# Patient Record
Sex: Female | Born: 1940 | Race: White | Hispanic: No | Smoking: Never smoker
Health system: Southern US, Community
[De-identification: ages and names within clinical notes are randomized; demographics above are authoritative.]

## PROBLEM LIST (undated history)

## (undated) DIAGNOSIS — R6251 Failure to thrive (child): Secondary | ICD-10-CM

## (undated) DIAGNOSIS — I739 Peripheral vascular disease, unspecified: Secondary | ICD-10-CM

## (undated) DIAGNOSIS — K219 Gastro-esophageal reflux disease without esophagitis: Secondary | ICD-10-CM

## (undated) DIAGNOSIS — F0390 Unspecified dementia without behavioral disturbance: Secondary | ICD-10-CM

## (undated) DIAGNOSIS — F419 Anxiety disorder, unspecified: Secondary | ICD-10-CM

## (undated) DIAGNOSIS — F259 Schizoaffective disorder, unspecified: Secondary | ICD-10-CM

## (undated) HISTORY — PX: OTHER SURGICAL HISTORY: SHX169

## (undated) HISTORY — DX: Schizoaffective disorder, unspecified: F25.9

## (undated) HISTORY — PX: DILATION AND CURETTAGE, DIAGNOSTIC / THERAPEUTIC: SUR384

## (undated) HISTORY — PX: APPENDECTOMY: SHX54

## (undated) HISTORY — DX: Failure to thrive (child): R62.51

## (undated) HISTORY — DX: Gastro-esophageal reflux disease without esophagitis: K21.9

## (undated) HISTORY — DX: Unspecified dementia, unspecified severity, without behavioral disturbance, psychotic disturbance, mood disturbance, and anxiety: F03.90

## (undated) HISTORY — PX: CHOLECYSTECTOMY: SHX55

## (undated) HISTORY — DX: Anxiety disorder, unspecified: F41.9

## (undated) HISTORY — DX: Peripheral vascular disease, unspecified: I73.9

## (undated) HISTORY — PX: PEG PLACEMENT: SHX5437

---

## 2004-08-12 ENCOUNTER — Ambulatory Visit: Payer: Self-pay | Admitting: Internal Medicine

## 2004-08-19 ENCOUNTER — Ambulatory Visit: Payer: Self-pay | Admitting: Cardiology

## 2004-12-19 ENCOUNTER — Encounter: Admission: RE | Admit: 2004-12-19 | Discharge: 2004-12-19 | Payer: Self-pay | Admitting: General Surgery

## 2006-01-04 ENCOUNTER — Encounter: Admission: RE | Admit: 2006-01-04 | Discharge: 2006-01-04 | Payer: Self-pay | Admitting: General Surgery

## 2015-02-08 DIAGNOSIS — F79 Unspecified intellectual disabilities: Secondary | ICD-10-CM | POA: Diagnosis not present

## 2015-02-08 DIAGNOSIS — Z87891 Personal history of nicotine dependence: Secondary | ICD-10-CM | POA: Diagnosis not present

## 2015-02-08 DIAGNOSIS — I739 Peripheral vascular disease, unspecified: Secondary | ICD-10-CM | POA: Diagnosis not present

## 2015-02-08 DIAGNOSIS — K219 Gastro-esophageal reflux disease without esophagitis: Secondary | ICD-10-CM | POA: Diagnosis not present

## 2015-02-08 DIAGNOSIS — Z418 Encounter for other procedures for purposes other than remedying health state: Secondary | ICD-10-CM | POA: Diagnosis not present

## 2015-02-18 DIAGNOSIS — Z1231 Encounter for screening mammogram for malignant neoplasm of breast: Secondary | ICD-10-CM | POA: Diagnosis not present

## 2015-04-18 DIAGNOSIS — H2513 Age-related nuclear cataract, bilateral: Secondary | ICD-10-CM | POA: Diagnosis not present

## 2015-05-14 DIAGNOSIS — Z6833 Body mass index (BMI) 33.0-33.9, adult: Secondary | ICD-10-CM | POA: Diagnosis not present

## 2015-05-14 DIAGNOSIS — E7800 Pure hypercholesterolemia, unspecified: Secondary | ICD-10-CM | POA: Diagnosis not present

## 2015-05-14 DIAGNOSIS — Z713 Dietary counseling and surveillance: Secondary | ICD-10-CM | POA: Diagnosis not present

## 2015-05-14 DIAGNOSIS — F79 Unspecified intellectual disabilities: Secondary | ICD-10-CM | POA: Diagnosis not present

## 2015-05-14 DIAGNOSIS — Z299 Encounter for prophylactic measures, unspecified: Secondary | ICD-10-CM | POA: Diagnosis not present

## 2015-06-20 DIAGNOSIS — F0390 Unspecified dementia without behavioral disturbance: Secondary | ICD-10-CM | POA: Diagnosis not present

## 2015-06-20 DIAGNOSIS — E7800 Pure hypercholesterolemia, unspecified: Secondary | ICD-10-CM | POA: Diagnosis not present

## 2015-08-28 DIAGNOSIS — E7800 Pure hypercholesterolemia, unspecified: Secondary | ICD-10-CM | POA: Diagnosis not present

## 2015-08-28 DIAGNOSIS — F0390 Unspecified dementia without behavioral disturbance: Secondary | ICD-10-CM | POA: Diagnosis not present

## 2015-09-24 DIAGNOSIS — Z1389 Encounter for screening for other disorder: Secondary | ICD-10-CM | POA: Diagnosis not present

## 2015-09-24 DIAGNOSIS — Z1211 Encounter for screening for malignant neoplasm of colon: Secondary | ICD-10-CM | POA: Diagnosis not present

## 2015-09-24 DIAGNOSIS — Z79899 Other long term (current) drug therapy: Secondary | ICD-10-CM | POA: Diagnosis not present

## 2015-09-24 DIAGNOSIS — Z6832 Body mass index (BMI) 32.0-32.9, adult: Secondary | ICD-10-CM | POA: Diagnosis not present

## 2015-09-24 DIAGNOSIS — E7800 Pure hypercholesterolemia, unspecified: Secondary | ICD-10-CM | POA: Diagnosis not present

## 2015-09-24 DIAGNOSIS — Z7189 Other specified counseling: Secondary | ICD-10-CM | POA: Diagnosis not present

## 2015-09-24 DIAGNOSIS — Z0000 Encounter for general adult medical examination without abnormal findings: Secondary | ICD-10-CM | POA: Diagnosis not present

## 2015-09-24 DIAGNOSIS — R5383 Other fatigue: Secondary | ICD-10-CM | POA: Diagnosis not present

## 2015-09-24 DIAGNOSIS — Z299 Encounter for prophylactic measures, unspecified: Secondary | ICD-10-CM | POA: Diagnosis not present

## 2015-10-18 DIAGNOSIS — Z6833 Body mass index (BMI) 33.0-33.9, adult: Secondary | ICD-10-CM | POA: Diagnosis not present

## 2015-10-18 DIAGNOSIS — I739 Peripheral vascular disease, unspecified: Secondary | ICD-10-CM | POA: Diagnosis not present

## 2015-10-18 DIAGNOSIS — E7800 Pure hypercholesterolemia, unspecified: Secondary | ICD-10-CM | POA: Diagnosis not present

## 2015-10-18 DIAGNOSIS — F79 Unspecified intellectual disabilities: Secondary | ICD-10-CM | POA: Diagnosis not present

## 2015-10-18 DIAGNOSIS — Z299 Encounter for prophylactic measures, unspecified: Secondary | ICD-10-CM | POA: Diagnosis not present

## 2015-10-31 DIAGNOSIS — F0390 Unspecified dementia without behavioral disturbance: Secondary | ICD-10-CM | POA: Diagnosis not present

## 2015-10-31 DIAGNOSIS — E7800 Pure hypercholesterolemia, unspecified: Secondary | ICD-10-CM | POA: Diagnosis not present

## 2015-11-19 DIAGNOSIS — M810 Age-related osteoporosis without current pathological fracture: Secondary | ICD-10-CM | POA: Diagnosis not present

## 2015-11-19 DIAGNOSIS — M8580 Other specified disorders of bone density and structure, unspecified site: Secondary | ICD-10-CM | POA: Diagnosis not present

## 2015-12-18 DIAGNOSIS — F0390 Unspecified dementia without behavioral disturbance: Secondary | ICD-10-CM | POA: Diagnosis not present

## 2015-12-18 DIAGNOSIS — E7800 Pure hypercholesterolemia, unspecified: Secondary | ICD-10-CM | POA: Diagnosis not present

## 2016-01-24 DIAGNOSIS — F0390 Unspecified dementia without behavioral disturbance: Secondary | ICD-10-CM | POA: Diagnosis not present

## 2016-01-24 DIAGNOSIS — E7800 Pure hypercholesterolemia, unspecified: Secondary | ICD-10-CM | POA: Diagnosis not present

## 2016-02-07 DIAGNOSIS — Z299 Encounter for prophylactic measures, unspecified: Secondary | ICD-10-CM | POA: Diagnosis not present

## 2016-02-07 DIAGNOSIS — J069 Acute upper respiratory infection, unspecified: Secondary | ICD-10-CM | POA: Diagnosis not present

## 2016-02-07 DIAGNOSIS — E7800 Pure hypercholesterolemia, unspecified: Secondary | ICD-10-CM | POA: Diagnosis not present

## 2016-02-07 DIAGNOSIS — I739 Peripheral vascular disease, unspecified: Secondary | ICD-10-CM | POA: Diagnosis not present

## 2016-02-07 DIAGNOSIS — F79 Unspecified intellectual disabilities: Secondary | ICD-10-CM | POA: Diagnosis not present

## 2016-02-07 DIAGNOSIS — Z789 Other specified health status: Secondary | ICD-10-CM | POA: Diagnosis not present

## 2016-02-07 DIAGNOSIS — Z6833 Body mass index (BMI) 33.0-33.9, adult: Secondary | ICD-10-CM | POA: Diagnosis not present

## 2016-02-19 DIAGNOSIS — F0390 Unspecified dementia without behavioral disturbance: Secondary | ICD-10-CM | POA: Diagnosis not present

## 2016-02-19 DIAGNOSIS — E7800 Pure hypercholesterolemia, unspecified: Secondary | ICD-10-CM | POA: Diagnosis not present

## 2016-02-27 DIAGNOSIS — Z1231 Encounter for screening mammogram for malignant neoplasm of breast: Secondary | ICD-10-CM | POA: Diagnosis not present

## 2016-03-27 DIAGNOSIS — E7800 Pure hypercholesterolemia, unspecified: Secondary | ICD-10-CM | POA: Diagnosis not present

## 2016-03-27 DIAGNOSIS — F0390 Unspecified dementia without behavioral disturbance: Secondary | ICD-10-CM | POA: Diagnosis not present

## 2016-04-27 DIAGNOSIS — F0390 Unspecified dementia without behavioral disturbance: Secondary | ICD-10-CM | POA: Diagnosis not present

## 2016-04-27 DIAGNOSIS — E7800 Pure hypercholesterolemia, unspecified: Secondary | ICD-10-CM | POA: Diagnosis not present

## 2016-06-09 DIAGNOSIS — H2513 Age-related nuclear cataract, bilateral: Secondary | ICD-10-CM | POA: Diagnosis not present

## 2016-07-09 DIAGNOSIS — H40013 Open angle with borderline findings, low risk, bilateral: Secondary | ICD-10-CM | POA: Diagnosis not present

## 2016-07-09 DIAGNOSIS — H35033 Hypertensive retinopathy, bilateral: Secondary | ICD-10-CM | POA: Diagnosis not present

## 2016-07-09 DIAGNOSIS — H2513 Age-related nuclear cataract, bilateral: Secondary | ICD-10-CM | POA: Diagnosis not present

## 2016-07-09 DIAGNOSIS — H25013 Cortical age-related cataract, bilateral: Secondary | ICD-10-CM | POA: Diagnosis not present

## 2016-08-06 DIAGNOSIS — Z6832 Body mass index (BMI) 32.0-32.9, adult: Secondary | ICD-10-CM | POA: Diagnosis not present

## 2016-08-06 DIAGNOSIS — Z79899 Other long term (current) drug therapy: Secondary | ICD-10-CM | POA: Diagnosis not present

## 2016-08-06 DIAGNOSIS — Z299 Encounter for prophylactic measures, unspecified: Secondary | ICD-10-CM | POA: Diagnosis not present

## 2016-08-06 DIAGNOSIS — F79 Unspecified intellectual disabilities: Secondary | ICD-10-CM | POA: Diagnosis not present

## 2016-08-06 DIAGNOSIS — I739 Peripheral vascular disease, unspecified: Secondary | ICD-10-CM | POA: Diagnosis not present

## 2016-08-06 DIAGNOSIS — E7800 Pure hypercholesterolemia, unspecified: Secondary | ICD-10-CM | POA: Diagnosis not present

## 2016-08-10 DIAGNOSIS — Z79899 Other long term (current) drug therapy: Secondary | ICD-10-CM | POA: Diagnosis not present

## 2016-08-10 DIAGNOSIS — E7800 Pure hypercholesterolemia, unspecified: Secondary | ICD-10-CM | POA: Diagnosis not present

## 2016-09-16 DIAGNOSIS — F0390 Unspecified dementia without behavioral disturbance: Secondary | ICD-10-CM | POA: Diagnosis not present

## 2016-09-16 DIAGNOSIS — E7800 Pure hypercholesterolemia, unspecified: Secondary | ICD-10-CM | POA: Diagnosis not present

## 2016-09-28 DIAGNOSIS — Z1211 Encounter for screening for malignant neoplasm of colon: Secondary | ICD-10-CM | POA: Diagnosis not present

## 2016-09-28 DIAGNOSIS — Z0000 Encounter for general adult medical examination without abnormal findings: Secondary | ICD-10-CM | POA: Diagnosis not present

## 2016-09-28 DIAGNOSIS — Z1389 Encounter for screening for other disorder: Secondary | ICD-10-CM | POA: Diagnosis not present

## 2016-09-28 DIAGNOSIS — R5383 Other fatigue: Secondary | ICD-10-CM | POA: Diagnosis not present

## 2016-09-28 DIAGNOSIS — Z6832 Body mass index (BMI) 32.0-32.9, adult: Secondary | ICD-10-CM | POA: Diagnosis not present

## 2016-09-28 DIAGNOSIS — F79 Unspecified intellectual disabilities: Secondary | ICD-10-CM | POA: Diagnosis not present

## 2016-09-28 DIAGNOSIS — Z7189 Other specified counseling: Secondary | ICD-10-CM | POA: Diagnosis not present

## 2016-09-28 DIAGNOSIS — E7800 Pure hypercholesterolemia, unspecified: Secondary | ICD-10-CM | POA: Diagnosis not present

## 2016-09-28 DIAGNOSIS — Z299 Encounter for prophylactic measures, unspecified: Secondary | ICD-10-CM | POA: Diagnosis not present

## 2016-09-28 DIAGNOSIS — E559 Vitamin D deficiency, unspecified: Secondary | ICD-10-CM | POA: Diagnosis not present

## 2016-09-28 DIAGNOSIS — Z79899 Other long term (current) drug therapy: Secondary | ICD-10-CM | POA: Diagnosis not present

## 2016-09-28 DIAGNOSIS — I739 Peripheral vascular disease, unspecified: Secondary | ICD-10-CM | POA: Diagnosis not present

## 2016-09-30 DIAGNOSIS — H2513 Age-related nuclear cataract, bilateral: Secondary | ICD-10-CM | POA: Diagnosis not present

## 2016-09-30 DIAGNOSIS — H1859 Other hereditary corneal dystrophies: Secondary | ICD-10-CM | POA: Diagnosis not present

## 2016-11-18 DIAGNOSIS — E7800 Pure hypercholesterolemia, unspecified: Secondary | ICD-10-CM | POA: Diagnosis not present

## 2016-11-18 DIAGNOSIS — F0390 Unspecified dementia without behavioral disturbance: Secondary | ICD-10-CM | POA: Diagnosis not present

## 2016-12-03 DIAGNOSIS — F0390 Unspecified dementia without behavioral disturbance: Secondary | ICD-10-CM | POA: Diagnosis not present

## 2016-12-03 DIAGNOSIS — E7800 Pure hypercholesterolemia, unspecified: Secondary | ICD-10-CM | POA: Diagnosis not present

## 2016-12-07 DIAGNOSIS — H2513 Age-related nuclear cataract, bilateral: Secondary | ICD-10-CM | POA: Diagnosis not present

## 2016-12-07 DIAGNOSIS — H1859 Other hereditary corneal dystrophies: Secondary | ICD-10-CM | POA: Diagnosis not present

## 2016-12-28 DIAGNOSIS — Z6832 Body mass index (BMI) 32.0-32.9, adult: Secondary | ICD-10-CM | POA: Diagnosis not present

## 2016-12-28 DIAGNOSIS — Z2821 Immunization not carried out because of patient refusal: Secondary | ICD-10-CM | POA: Diagnosis not present

## 2016-12-28 DIAGNOSIS — Z299 Encounter for prophylactic measures, unspecified: Secondary | ICD-10-CM | POA: Diagnosis not present

## 2016-12-28 DIAGNOSIS — E7800 Pure hypercholesterolemia, unspecified: Secondary | ICD-10-CM | POA: Diagnosis not present

## 2016-12-28 DIAGNOSIS — I739 Peripheral vascular disease, unspecified: Secondary | ICD-10-CM | POA: Diagnosis not present

## 2017-01-11 DIAGNOSIS — E7800 Pure hypercholesterolemia, unspecified: Secondary | ICD-10-CM | POA: Diagnosis not present

## 2017-01-11 DIAGNOSIS — F0390 Unspecified dementia without behavioral disturbance: Secondary | ICD-10-CM | POA: Diagnosis not present

## 2017-02-05 DIAGNOSIS — F79 Unspecified intellectual disabilities: Secondary | ICD-10-CM | POA: Diagnosis not present

## 2017-02-05 DIAGNOSIS — Z299 Encounter for prophylactic measures, unspecified: Secondary | ICD-10-CM | POA: Diagnosis not present

## 2017-02-05 DIAGNOSIS — I739 Peripheral vascular disease, unspecified: Secondary | ICD-10-CM | POA: Diagnosis not present

## 2017-02-05 DIAGNOSIS — E7800 Pure hypercholesterolemia, unspecified: Secondary | ICD-10-CM | POA: Diagnosis not present

## 2017-02-05 DIAGNOSIS — Z6832 Body mass index (BMI) 32.0-32.9, adult: Secondary | ICD-10-CM | POA: Diagnosis not present

## 2017-02-05 DIAGNOSIS — Z789 Other specified health status: Secondary | ICD-10-CM | POA: Diagnosis not present

## 2017-03-01 DIAGNOSIS — Z1231 Encounter for screening mammogram for malignant neoplasm of breast: Secondary | ICD-10-CM | POA: Diagnosis not present

## 2017-03-02 DIAGNOSIS — F0390 Unspecified dementia without behavioral disturbance: Secondary | ICD-10-CM | POA: Diagnosis not present

## 2017-03-02 DIAGNOSIS — E7800 Pure hypercholesterolemia, unspecified: Secondary | ICD-10-CM | POA: Diagnosis not present

## 2017-03-15 DIAGNOSIS — H2513 Age-related nuclear cataract, bilateral: Secondary | ICD-10-CM | POA: Diagnosis not present

## 2017-03-15 DIAGNOSIS — H1859 Other hereditary corneal dystrophies: Secondary | ICD-10-CM | POA: Diagnosis not present

## 2017-03-30 DIAGNOSIS — F0390 Unspecified dementia without behavioral disturbance: Secondary | ICD-10-CM | POA: Diagnosis not present

## 2017-03-30 DIAGNOSIS — E7800 Pure hypercholesterolemia, unspecified: Secondary | ICD-10-CM | POA: Diagnosis not present

## 2017-04-26 DIAGNOSIS — F0390 Unspecified dementia without behavioral disturbance: Secondary | ICD-10-CM | POA: Diagnosis not present

## 2017-04-26 DIAGNOSIS — E7800 Pure hypercholesterolemia, unspecified: Secondary | ICD-10-CM | POA: Diagnosis not present

## 2017-05-19 DIAGNOSIS — H2513 Age-related nuclear cataract, bilateral: Secondary | ICD-10-CM | POA: Diagnosis not present

## 2017-05-19 DIAGNOSIS — H1859 Other hereditary corneal dystrophies: Secondary | ICD-10-CM | POA: Diagnosis not present

## 2017-06-21 DIAGNOSIS — Z6832 Body mass index (BMI) 32.0-32.9, adult: Secondary | ICD-10-CM | POA: Diagnosis not present

## 2017-06-21 DIAGNOSIS — E7800 Pure hypercholesterolemia, unspecified: Secondary | ICD-10-CM | POA: Diagnosis not present

## 2017-06-21 DIAGNOSIS — F79 Unspecified intellectual disabilities: Secondary | ICD-10-CM | POA: Diagnosis not present

## 2017-06-21 DIAGNOSIS — M1990 Unspecified osteoarthritis, unspecified site: Secondary | ICD-10-CM | POA: Diagnosis not present

## 2017-06-21 DIAGNOSIS — Z299 Encounter for prophylactic measures, unspecified: Secondary | ICD-10-CM | POA: Diagnosis not present

## 2017-06-21 DIAGNOSIS — M1710 Unilateral primary osteoarthritis, unspecified knee: Secondary | ICD-10-CM | POA: Diagnosis not present

## 2017-07-01 DIAGNOSIS — F79 Unspecified intellectual disabilities: Secondary | ICD-10-CM | POA: Diagnosis not present

## 2017-07-01 DIAGNOSIS — I739 Peripheral vascular disease, unspecified: Secondary | ICD-10-CM | POA: Diagnosis not present

## 2017-07-01 DIAGNOSIS — K219 Gastro-esophageal reflux disease without esophagitis: Secondary | ICD-10-CM | POA: Diagnosis not present

## 2017-07-01 DIAGNOSIS — E7800 Pure hypercholesterolemia, unspecified: Secondary | ICD-10-CM | POA: Diagnosis not present

## 2017-07-01 DIAGNOSIS — Z87891 Personal history of nicotine dependence: Secondary | ICD-10-CM | POA: Diagnosis not present

## 2017-07-01 DIAGNOSIS — M1712 Unilateral primary osteoarthritis, left knee: Secondary | ICD-10-CM | POA: Diagnosis not present

## 2017-07-01 DIAGNOSIS — M810 Age-related osteoporosis without current pathological fracture: Secondary | ICD-10-CM | POA: Diagnosis not present

## 2017-07-05 DIAGNOSIS — K219 Gastro-esophageal reflux disease without esophagitis: Secondary | ICD-10-CM | POA: Diagnosis not present

## 2017-07-05 DIAGNOSIS — M810 Age-related osteoporosis without current pathological fracture: Secondary | ICD-10-CM | POA: Diagnosis not present

## 2017-07-05 DIAGNOSIS — M1712 Unilateral primary osteoarthritis, left knee: Secondary | ICD-10-CM | POA: Diagnosis not present

## 2017-07-05 DIAGNOSIS — I739 Peripheral vascular disease, unspecified: Secondary | ICD-10-CM | POA: Diagnosis not present

## 2017-07-05 DIAGNOSIS — E7800 Pure hypercholesterolemia, unspecified: Secondary | ICD-10-CM | POA: Diagnosis not present

## 2017-07-05 DIAGNOSIS — F79 Unspecified intellectual disabilities: Secondary | ICD-10-CM | POA: Diagnosis not present

## 2017-07-09 DIAGNOSIS — M1712 Unilateral primary osteoarthritis, left knee: Secondary | ICD-10-CM | POA: Diagnosis not present

## 2017-07-09 DIAGNOSIS — M810 Age-related osteoporosis without current pathological fracture: Secondary | ICD-10-CM | POA: Diagnosis not present

## 2017-07-09 DIAGNOSIS — K219 Gastro-esophageal reflux disease without esophagitis: Secondary | ICD-10-CM | POA: Diagnosis not present

## 2017-07-09 DIAGNOSIS — F79 Unspecified intellectual disabilities: Secondary | ICD-10-CM | POA: Diagnosis not present

## 2017-07-09 DIAGNOSIS — E7800 Pure hypercholesterolemia, unspecified: Secondary | ICD-10-CM | POA: Diagnosis not present

## 2017-07-09 DIAGNOSIS — I739 Peripheral vascular disease, unspecified: Secondary | ICD-10-CM | POA: Diagnosis not present

## 2017-07-13 DIAGNOSIS — K219 Gastro-esophageal reflux disease without esophagitis: Secondary | ICD-10-CM | POA: Diagnosis not present

## 2017-07-13 DIAGNOSIS — I739 Peripheral vascular disease, unspecified: Secondary | ICD-10-CM | POA: Diagnosis not present

## 2017-07-13 DIAGNOSIS — E7800 Pure hypercholesterolemia, unspecified: Secondary | ICD-10-CM | POA: Diagnosis not present

## 2017-07-13 DIAGNOSIS — F79 Unspecified intellectual disabilities: Secondary | ICD-10-CM | POA: Diagnosis not present

## 2017-07-13 DIAGNOSIS — M810 Age-related osteoporosis without current pathological fracture: Secondary | ICD-10-CM | POA: Diagnosis not present

## 2017-07-13 DIAGNOSIS — M1712 Unilateral primary osteoarthritis, left knee: Secondary | ICD-10-CM | POA: Diagnosis not present

## 2017-07-14 DIAGNOSIS — M810 Age-related osteoporosis without current pathological fracture: Secondary | ICD-10-CM | POA: Diagnosis not present

## 2017-07-14 DIAGNOSIS — K219 Gastro-esophageal reflux disease without esophagitis: Secondary | ICD-10-CM | POA: Diagnosis not present

## 2017-07-14 DIAGNOSIS — M1712 Unilateral primary osteoarthritis, left knee: Secondary | ICD-10-CM | POA: Diagnosis not present

## 2017-07-14 DIAGNOSIS — F79 Unspecified intellectual disabilities: Secondary | ICD-10-CM | POA: Diagnosis not present

## 2017-07-14 DIAGNOSIS — I739 Peripheral vascular disease, unspecified: Secondary | ICD-10-CM | POA: Diagnosis not present

## 2017-07-14 DIAGNOSIS — E7800 Pure hypercholesterolemia, unspecified: Secondary | ICD-10-CM | POA: Diagnosis not present

## 2017-07-19 DIAGNOSIS — F79 Unspecified intellectual disabilities: Secondary | ICD-10-CM | POA: Diagnosis not present

## 2017-07-19 DIAGNOSIS — I739 Peripheral vascular disease, unspecified: Secondary | ICD-10-CM | POA: Diagnosis not present

## 2017-07-19 DIAGNOSIS — M1712 Unilateral primary osteoarthritis, left knee: Secondary | ICD-10-CM | POA: Diagnosis not present

## 2017-07-19 DIAGNOSIS — E7800 Pure hypercholesterolemia, unspecified: Secondary | ICD-10-CM | POA: Diagnosis not present

## 2017-07-19 DIAGNOSIS — K219 Gastro-esophageal reflux disease without esophagitis: Secondary | ICD-10-CM | POA: Diagnosis not present

## 2017-07-19 DIAGNOSIS — M810 Age-related osteoporosis without current pathological fracture: Secondary | ICD-10-CM | POA: Diagnosis not present

## 2017-07-21 DIAGNOSIS — F0390 Unspecified dementia without behavioral disturbance: Secondary | ICD-10-CM | POA: Diagnosis not present

## 2017-07-21 DIAGNOSIS — E7800 Pure hypercholesterolemia, unspecified: Secondary | ICD-10-CM | POA: Diagnosis not present

## 2017-07-21 DIAGNOSIS — M1712 Unilateral primary osteoarthritis, left knee: Secondary | ICD-10-CM | POA: Diagnosis not present

## 2017-07-21 DIAGNOSIS — I739 Peripheral vascular disease, unspecified: Secondary | ICD-10-CM | POA: Diagnosis not present

## 2017-07-21 DIAGNOSIS — M810 Age-related osteoporosis without current pathological fracture: Secondary | ICD-10-CM | POA: Diagnosis not present

## 2017-07-21 DIAGNOSIS — K219 Gastro-esophageal reflux disease without esophagitis: Secondary | ICD-10-CM | POA: Diagnosis not present

## 2017-07-21 DIAGNOSIS — F79 Unspecified intellectual disabilities: Secondary | ICD-10-CM | POA: Diagnosis not present

## 2017-07-22 DIAGNOSIS — F79 Unspecified intellectual disabilities: Secondary | ICD-10-CM | POA: Diagnosis not present

## 2017-07-22 DIAGNOSIS — Z6832 Body mass index (BMI) 32.0-32.9, adult: Secondary | ICD-10-CM | POA: Diagnosis not present

## 2017-07-22 DIAGNOSIS — I739 Peripheral vascular disease, unspecified: Secondary | ICD-10-CM | POA: Diagnosis not present

## 2017-07-22 DIAGNOSIS — Z299 Encounter for prophylactic measures, unspecified: Secondary | ICD-10-CM | POA: Diagnosis not present

## 2017-07-22 DIAGNOSIS — Z713 Dietary counseling and surveillance: Secondary | ICD-10-CM | POA: Diagnosis not present

## 2017-07-26 DIAGNOSIS — I739 Peripheral vascular disease, unspecified: Secondary | ICD-10-CM | POA: Diagnosis not present

## 2017-07-26 DIAGNOSIS — M1712 Unilateral primary osteoarthritis, left knee: Secondary | ICD-10-CM | POA: Diagnosis not present

## 2017-07-26 DIAGNOSIS — E7800 Pure hypercholesterolemia, unspecified: Secondary | ICD-10-CM | POA: Diagnosis not present

## 2017-07-26 DIAGNOSIS — F79 Unspecified intellectual disabilities: Secondary | ICD-10-CM | POA: Diagnosis not present

## 2017-07-26 DIAGNOSIS — K219 Gastro-esophageal reflux disease without esophagitis: Secondary | ICD-10-CM | POA: Diagnosis not present

## 2017-07-26 DIAGNOSIS — M810 Age-related osteoporosis without current pathological fracture: Secondary | ICD-10-CM | POA: Diagnosis not present

## 2017-07-27 DIAGNOSIS — I739 Peripheral vascular disease, unspecified: Secondary | ICD-10-CM | POA: Diagnosis not present

## 2017-07-27 DIAGNOSIS — K219 Gastro-esophageal reflux disease without esophagitis: Secondary | ICD-10-CM | POA: Diagnosis not present

## 2017-07-27 DIAGNOSIS — M810 Age-related osteoporosis without current pathological fracture: Secondary | ICD-10-CM | POA: Diagnosis not present

## 2017-07-27 DIAGNOSIS — M1712 Unilateral primary osteoarthritis, left knee: Secondary | ICD-10-CM | POA: Diagnosis not present

## 2017-07-27 DIAGNOSIS — E7800 Pure hypercholesterolemia, unspecified: Secondary | ICD-10-CM | POA: Diagnosis not present

## 2017-07-27 DIAGNOSIS — F79 Unspecified intellectual disabilities: Secondary | ICD-10-CM | POA: Diagnosis not present

## 2017-08-04 DIAGNOSIS — K219 Gastro-esophageal reflux disease without esophagitis: Secondary | ICD-10-CM | POA: Diagnosis not present

## 2017-08-04 DIAGNOSIS — M810 Age-related osteoporosis without current pathological fracture: Secondary | ICD-10-CM | POA: Diagnosis not present

## 2017-08-04 DIAGNOSIS — M1712 Unilateral primary osteoarthritis, left knee: Secondary | ICD-10-CM | POA: Diagnosis not present

## 2017-08-04 DIAGNOSIS — F79 Unspecified intellectual disabilities: Secondary | ICD-10-CM | POA: Diagnosis not present

## 2017-08-04 DIAGNOSIS — E7800 Pure hypercholesterolemia, unspecified: Secondary | ICD-10-CM | POA: Diagnosis not present

## 2017-08-04 DIAGNOSIS — I739 Peripheral vascular disease, unspecified: Secondary | ICD-10-CM | POA: Diagnosis not present

## 2017-08-06 DIAGNOSIS — M1712 Unilateral primary osteoarthritis, left knee: Secondary | ICD-10-CM | POA: Diagnosis not present

## 2017-08-06 DIAGNOSIS — K219 Gastro-esophageal reflux disease without esophagitis: Secondary | ICD-10-CM | POA: Diagnosis not present

## 2017-08-06 DIAGNOSIS — F79 Unspecified intellectual disabilities: Secondary | ICD-10-CM | POA: Diagnosis not present

## 2017-08-06 DIAGNOSIS — I739 Peripheral vascular disease, unspecified: Secondary | ICD-10-CM | POA: Diagnosis not present

## 2017-08-06 DIAGNOSIS — M810 Age-related osteoporosis without current pathological fracture: Secondary | ICD-10-CM | POA: Diagnosis not present

## 2017-08-06 DIAGNOSIS — E7800 Pure hypercholesterolemia, unspecified: Secondary | ICD-10-CM | POA: Diagnosis not present

## 2017-08-09 DIAGNOSIS — F79 Unspecified intellectual disabilities: Secondary | ICD-10-CM | POA: Diagnosis not present

## 2017-08-09 DIAGNOSIS — I739 Peripheral vascular disease, unspecified: Secondary | ICD-10-CM | POA: Diagnosis not present

## 2017-08-09 DIAGNOSIS — M810 Age-related osteoporosis without current pathological fracture: Secondary | ICD-10-CM | POA: Diagnosis not present

## 2017-08-09 DIAGNOSIS — E7800 Pure hypercholesterolemia, unspecified: Secondary | ICD-10-CM | POA: Diagnosis not present

## 2017-08-09 DIAGNOSIS — K219 Gastro-esophageal reflux disease without esophagitis: Secondary | ICD-10-CM | POA: Diagnosis not present

## 2017-08-09 DIAGNOSIS — M1712 Unilateral primary osteoarthritis, left knee: Secondary | ICD-10-CM | POA: Diagnosis not present

## 2017-08-11 DIAGNOSIS — F79 Unspecified intellectual disabilities: Secondary | ICD-10-CM | POA: Diagnosis not present

## 2017-08-11 DIAGNOSIS — E7800 Pure hypercholesterolemia, unspecified: Secondary | ICD-10-CM | POA: Diagnosis not present

## 2017-08-11 DIAGNOSIS — M810 Age-related osteoporosis without current pathological fracture: Secondary | ICD-10-CM | POA: Diagnosis not present

## 2017-08-11 DIAGNOSIS — K219 Gastro-esophageal reflux disease without esophagitis: Secondary | ICD-10-CM | POA: Diagnosis not present

## 2017-08-11 DIAGNOSIS — I739 Peripheral vascular disease, unspecified: Secondary | ICD-10-CM | POA: Diagnosis not present

## 2017-08-11 DIAGNOSIS — M1712 Unilateral primary osteoarthritis, left knee: Secondary | ICD-10-CM | POA: Diagnosis not present

## 2017-08-16 DIAGNOSIS — I739 Peripheral vascular disease, unspecified: Secondary | ICD-10-CM | POA: Diagnosis not present

## 2017-08-16 DIAGNOSIS — M1712 Unilateral primary osteoarthritis, left knee: Secondary | ICD-10-CM | POA: Diagnosis not present

## 2017-08-16 DIAGNOSIS — K219 Gastro-esophageal reflux disease without esophagitis: Secondary | ICD-10-CM | POA: Diagnosis not present

## 2017-08-16 DIAGNOSIS — E7800 Pure hypercholesterolemia, unspecified: Secondary | ICD-10-CM | POA: Diagnosis not present

## 2017-08-16 DIAGNOSIS — M810 Age-related osteoporosis without current pathological fracture: Secondary | ICD-10-CM | POA: Diagnosis not present

## 2017-08-16 DIAGNOSIS — F79 Unspecified intellectual disabilities: Secondary | ICD-10-CM | POA: Diagnosis not present

## 2017-08-17 DIAGNOSIS — E7800 Pure hypercholesterolemia, unspecified: Secondary | ICD-10-CM | POA: Diagnosis not present

## 2017-08-17 DIAGNOSIS — F0390 Unspecified dementia without behavioral disturbance: Secondary | ICD-10-CM | POA: Diagnosis not present

## 2017-08-18 DIAGNOSIS — M1712 Unilateral primary osteoarthritis, left knee: Secondary | ICD-10-CM | POA: Diagnosis not present

## 2017-08-18 DIAGNOSIS — I739 Peripheral vascular disease, unspecified: Secondary | ICD-10-CM | POA: Diagnosis not present

## 2017-08-18 DIAGNOSIS — F79 Unspecified intellectual disabilities: Secondary | ICD-10-CM | POA: Diagnosis not present

## 2017-08-18 DIAGNOSIS — E7800 Pure hypercholesterolemia, unspecified: Secondary | ICD-10-CM | POA: Diagnosis not present

## 2017-08-18 DIAGNOSIS — K219 Gastro-esophageal reflux disease without esophagitis: Secondary | ICD-10-CM | POA: Diagnosis not present

## 2017-08-18 DIAGNOSIS — M810 Age-related osteoporosis without current pathological fracture: Secondary | ICD-10-CM | POA: Diagnosis not present

## 2017-08-23 DIAGNOSIS — M1712 Unilateral primary osteoarthritis, left knee: Secondary | ICD-10-CM | POA: Diagnosis not present

## 2017-08-23 DIAGNOSIS — K219 Gastro-esophageal reflux disease without esophagitis: Secondary | ICD-10-CM | POA: Diagnosis not present

## 2017-08-23 DIAGNOSIS — F79 Unspecified intellectual disabilities: Secondary | ICD-10-CM | POA: Diagnosis not present

## 2017-08-23 DIAGNOSIS — E7800 Pure hypercholesterolemia, unspecified: Secondary | ICD-10-CM | POA: Diagnosis not present

## 2017-08-23 DIAGNOSIS — I739 Peripheral vascular disease, unspecified: Secondary | ICD-10-CM | POA: Diagnosis not present

## 2017-08-23 DIAGNOSIS — M810 Age-related osteoporosis without current pathological fracture: Secondary | ICD-10-CM | POA: Diagnosis not present

## 2017-09-16 DIAGNOSIS — E7800 Pure hypercholesterolemia, unspecified: Secondary | ICD-10-CM | POA: Diagnosis not present

## 2017-09-16 DIAGNOSIS — F0390 Unspecified dementia without behavioral disturbance: Secondary | ICD-10-CM | POA: Diagnosis not present

## 2017-10-15 DIAGNOSIS — I10 Essential (primary) hypertension: Secondary | ICD-10-CM | POA: Diagnosis not present

## 2017-10-15 DIAGNOSIS — Z7189 Other specified counseling: Secondary | ICD-10-CM | POA: Diagnosis not present

## 2017-10-15 DIAGNOSIS — Z0000 Encounter for general adult medical examination without abnormal findings: Secondary | ICD-10-CM | POA: Diagnosis not present

## 2017-10-15 DIAGNOSIS — E7800 Pure hypercholesterolemia, unspecified: Secondary | ICD-10-CM | POA: Diagnosis not present

## 2017-10-15 DIAGNOSIS — Z1211 Encounter for screening for malignant neoplasm of colon: Secondary | ICD-10-CM | POA: Diagnosis not present

## 2017-10-15 DIAGNOSIS — Z6829 Body mass index (BMI) 29.0-29.9, adult: Secondary | ICD-10-CM | POA: Diagnosis not present

## 2017-10-15 DIAGNOSIS — E559 Vitamin D deficiency, unspecified: Secondary | ICD-10-CM | POA: Diagnosis not present

## 2017-10-15 DIAGNOSIS — Z299 Encounter for prophylactic measures, unspecified: Secondary | ICD-10-CM | POA: Diagnosis not present

## 2017-10-15 DIAGNOSIS — Z79899 Other long term (current) drug therapy: Secondary | ICD-10-CM | POA: Diagnosis not present

## 2017-10-15 DIAGNOSIS — Z1331 Encounter for screening for depression: Secondary | ICD-10-CM | POA: Diagnosis not present

## 2017-10-15 DIAGNOSIS — Z1339 Encounter for screening examination for other mental health and behavioral disorders: Secondary | ICD-10-CM | POA: Diagnosis not present

## 2017-10-15 DIAGNOSIS — I739 Peripheral vascular disease, unspecified: Secondary | ICD-10-CM | POA: Diagnosis not present

## 2017-10-15 DIAGNOSIS — R5383 Other fatigue: Secondary | ICD-10-CM | POA: Diagnosis not present

## 2017-10-18 DIAGNOSIS — E7800 Pure hypercholesterolemia, unspecified: Secondary | ICD-10-CM | POA: Diagnosis not present

## 2017-10-18 DIAGNOSIS — F0390 Unspecified dementia without behavioral disturbance: Secondary | ICD-10-CM | POA: Diagnosis not present

## 2017-11-16 DIAGNOSIS — E7800 Pure hypercholesterolemia, unspecified: Secondary | ICD-10-CM | POA: Diagnosis not present

## 2017-11-16 DIAGNOSIS — F0390 Unspecified dementia without behavioral disturbance: Secondary | ICD-10-CM | POA: Diagnosis not present

## 2017-11-23 DIAGNOSIS — Z2821 Immunization not carried out because of patient refusal: Secondary | ICD-10-CM | POA: Diagnosis not present

## 2017-11-23 DIAGNOSIS — Z299 Encounter for prophylactic measures, unspecified: Secondary | ICD-10-CM | POA: Diagnosis not present

## 2017-11-23 DIAGNOSIS — R195 Other fecal abnormalities: Secondary | ICD-10-CM | POA: Diagnosis not present

## 2017-11-23 DIAGNOSIS — F79 Unspecified intellectual disabilities: Secondary | ICD-10-CM | POA: Diagnosis not present

## 2017-11-23 DIAGNOSIS — E2839 Other primary ovarian failure: Secondary | ICD-10-CM | POA: Diagnosis not present

## 2017-11-23 DIAGNOSIS — Z6830 Body mass index (BMI) 30.0-30.9, adult: Secondary | ICD-10-CM | POA: Diagnosis not present

## 2017-12-07 DIAGNOSIS — K625 Hemorrhage of anus and rectum: Secondary | ICD-10-CM | POA: Diagnosis not present

## 2017-12-10 DIAGNOSIS — M810 Age-related osteoporosis without current pathological fracture: Secondary | ICD-10-CM | POA: Diagnosis not present

## 2017-12-10 DIAGNOSIS — K641 Second degree hemorrhoids: Secondary | ICD-10-CM | POA: Diagnosis not present

## 2017-12-10 DIAGNOSIS — Z79899 Other long term (current) drug therapy: Secondary | ICD-10-CM | POA: Diagnosis not present

## 2017-12-10 DIAGNOSIS — Z9049 Acquired absence of other specified parts of digestive tract: Secondary | ICD-10-CM | POA: Diagnosis not present

## 2017-12-10 DIAGNOSIS — M1990 Unspecified osteoarthritis, unspecified site: Secondary | ICD-10-CM | POA: Diagnosis not present

## 2017-12-10 DIAGNOSIS — R2681 Unsteadiness on feet: Secondary | ICD-10-CM | POA: Diagnosis not present

## 2017-12-10 DIAGNOSIS — I739 Peripheral vascular disease, unspecified: Secondary | ICD-10-CM | POA: Diagnosis not present

## 2017-12-10 DIAGNOSIS — K625 Hemorrhage of anus and rectum: Secondary | ICD-10-CM | POA: Diagnosis not present

## 2017-12-14 DIAGNOSIS — F0390 Unspecified dementia without behavioral disturbance: Secondary | ICD-10-CM | POA: Diagnosis not present

## 2017-12-14 DIAGNOSIS — E7800 Pure hypercholesterolemia, unspecified: Secondary | ICD-10-CM | POA: Diagnosis not present

## 2018-01-14 DIAGNOSIS — E7800 Pure hypercholesterolemia, unspecified: Secondary | ICD-10-CM | POA: Diagnosis not present

## 2018-01-14 DIAGNOSIS — F0390 Unspecified dementia without behavioral disturbance: Secondary | ICD-10-CM | POA: Diagnosis not present

## 2018-02-04 DIAGNOSIS — Z299 Encounter for prophylactic measures, unspecified: Secondary | ICD-10-CM | POA: Diagnosis not present

## 2018-02-04 DIAGNOSIS — Z789 Other specified health status: Secondary | ICD-10-CM | POA: Diagnosis not present

## 2018-02-04 DIAGNOSIS — I739 Peripheral vascular disease, unspecified: Secondary | ICD-10-CM | POA: Diagnosis not present

## 2018-02-04 DIAGNOSIS — M1990 Unspecified osteoarthritis, unspecified site: Secondary | ICD-10-CM | POA: Diagnosis not present

## 2018-02-04 DIAGNOSIS — F79 Unspecified intellectual disabilities: Secondary | ICD-10-CM | POA: Diagnosis not present

## 2018-02-04 DIAGNOSIS — Z6830 Body mass index (BMI) 30.0-30.9, adult: Secondary | ICD-10-CM | POA: Diagnosis not present

## 2018-02-11 DIAGNOSIS — F0390 Unspecified dementia without behavioral disturbance: Secondary | ICD-10-CM | POA: Diagnosis not present

## 2018-02-11 DIAGNOSIS — E7800 Pure hypercholesterolemia, unspecified: Secondary | ICD-10-CM | POA: Diagnosis not present

## 2018-03-03 DIAGNOSIS — Z1231 Encounter for screening mammogram for malignant neoplasm of breast: Secondary | ICD-10-CM | POA: Diagnosis not present

## 2018-03-14 DIAGNOSIS — F0390 Unspecified dementia without behavioral disturbance: Secondary | ICD-10-CM | POA: Diagnosis not present

## 2018-03-14 DIAGNOSIS — E7800 Pure hypercholesterolemia, unspecified: Secondary | ICD-10-CM | POA: Diagnosis not present

## 2018-05-05 DIAGNOSIS — F0390 Unspecified dementia without behavioral disturbance: Secondary | ICD-10-CM | POA: Diagnosis not present

## 2018-05-05 DIAGNOSIS — E7800 Pure hypercholesterolemia, unspecified: Secondary | ICD-10-CM | POA: Diagnosis not present

## 2018-07-08 DIAGNOSIS — E7800 Pure hypercholesterolemia, unspecified: Secondary | ICD-10-CM | POA: Diagnosis not present

## 2018-07-08 DIAGNOSIS — F0390 Unspecified dementia without behavioral disturbance: Secondary | ICD-10-CM | POA: Diagnosis not present

## 2018-08-10 DIAGNOSIS — M1990 Unspecified osteoarthritis, unspecified site: Secondary | ICD-10-CM | POA: Diagnosis not present

## 2018-08-10 DIAGNOSIS — I739 Peripheral vascular disease, unspecified: Secondary | ICD-10-CM | POA: Diagnosis not present

## 2018-08-10 DIAGNOSIS — M1710 Unilateral primary osteoarthritis, unspecified knee: Secondary | ICD-10-CM | POA: Diagnosis not present

## 2018-08-10 DIAGNOSIS — Z6830 Body mass index (BMI) 30.0-30.9, adult: Secondary | ICD-10-CM | POA: Diagnosis not present

## 2018-08-10 DIAGNOSIS — F79 Unspecified intellectual disabilities: Secondary | ICD-10-CM | POA: Diagnosis not present

## 2018-08-10 DIAGNOSIS — Z299 Encounter for prophylactic measures, unspecified: Secondary | ICD-10-CM | POA: Diagnosis not present

## 2018-08-11 DIAGNOSIS — F0390 Unspecified dementia without behavioral disturbance: Secondary | ICD-10-CM | POA: Diagnosis not present

## 2018-08-11 DIAGNOSIS — E7800 Pure hypercholesterolemia, unspecified: Secondary | ICD-10-CM | POA: Diagnosis not present

## 2018-09-08 DIAGNOSIS — F0390 Unspecified dementia without behavioral disturbance: Secondary | ICD-10-CM | POA: Diagnosis not present

## 2018-09-08 DIAGNOSIS — E7800 Pure hypercholesterolemia, unspecified: Secondary | ICD-10-CM | POA: Diagnosis not present

## 2018-10-04 DIAGNOSIS — E7800 Pure hypercholesterolemia, unspecified: Secondary | ICD-10-CM | POA: Diagnosis not present

## 2018-10-04 DIAGNOSIS — F0390 Unspecified dementia without behavioral disturbance: Secondary | ICD-10-CM | POA: Diagnosis not present

## 2018-10-19 DIAGNOSIS — R5383 Other fatigue: Secondary | ICD-10-CM | POA: Diagnosis not present

## 2018-10-19 DIAGNOSIS — Z6830 Body mass index (BMI) 30.0-30.9, adult: Secondary | ICD-10-CM | POA: Diagnosis not present

## 2018-10-19 DIAGNOSIS — Z1211 Encounter for screening for malignant neoplasm of colon: Secondary | ICD-10-CM | POA: Diagnosis not present

## 2018-10-19 DIAGNOSIS — Z1331 Encounter for screening for depression: Secondary | ICD-10-CM | POA: Diagnosis not present

## 2018-10-19 DIAGNOSIS — Z299 Encounter for prophylactic measures, unspecified: Secondary | ICD-10-CM | POA: Diagnosis not present

## 2018-10-19 DIAGNOSIS — E7800 Pure hypercholesterolemia, unspecified: Secondary | ICD-10-CM | POA: Diagnosis not present

## 2018-10-19 DIAGNOSIS — E559 Vitamin D deficiency, unspecified: Secondary | ICD-10-CM | POA: Diagnosis not present

## 2018-10-19 DIAGNOSIS — Z7189 Other specified counseling: Secondary | ICD-10-CM | POA: Diagnosis not present

## 2018-10-19 DIAGNOSIS — Z1339 Encounter for screening examination for other mental health and behavioral disorders: Secondary | ICD-10-CM | POA: Diagnosis not present

## 2018-10-19 DIAGNOSIS — Z0000 Encounter for general adult medical examination without abnormal findings: Secondary | ICD-10-CM | POA: Diagnosis not present

## 2018-10-24 DIAGNOSIS — Z79899 Other long term (current) drug therapy: Secondary | ICD-10-CM | POA: Diagnosis not present

## 2018-10-24 DIAGNOSIS — E559 Vitamin D deficiency, unspecified: Secondary | ICD-10-CM | POA: Diagnosis not present

## 2018-10-24 DIAGNOSIS — E7800 Pure hypercholesterolemia, unspecified: Secondary | ICD-10-CM | POA: Diagnosis not present

## 2018-10-24 DIAGNOSIS — R5383 Other fatigue: Secondary | ICD-10-CM | POA: Diagnosis not present

## 2018-11-09 DIAGNOSIS — F0390 Unspecified dementia without behavioral disturbance: Secondary | ICD-10-CM | POA: Diagnosis not present

## 2018-11-09 DIAGNOSIS — E7800 Pure hypercholesterolemia, unspecified: Secondary | ICD-10-CM | POA: Diagnosis not present

## 2018-11-17 DIAGNOSIS — H2513 Age-related nuclear cataract, bilateral: Secondary | ICD-10-CM | POA: Diagnosis not present

## 2019-01-09 DIAGNOSIS — F0390 Unspecified dementia without behavioral disturbance: Secondary | ICD-10-CM | POA: Diagnosis not present

## 2019-01-09 DIAGNOSIS — E7800 Pure hypercholesterolemia, unspecified: Secondary | ICD-10-CM | POA: Diagnosis not present

## 2019-02-07 DIAGNOSIS — Z789 Other specified health status: Secondary | ICD-10-CM | POA: Diagnosis not present

## 2019-02-07 DIAGNOSIS — Z2821 Immunization not carried out because of patient refusal: Secondary | ICD-10-CM | POA: Diagnosis not present

## 2019-02-07 DIAGNOSIS — Z299 Encounter for prophylactic measures, unspecified: Secondary | ICD-10-CM | POA: Diagnosis not present

## 2019-02-07 DIAGNOSIS — Z6830 Body mass index (BMI) 30.0-30.9, adult: Secondary | ICD-10-CM | POA: Diagnosis not present

## 2019-02-07 DIAGNOSIS — M1990 Unspecified osteoarthritis, unspecified site: Secondary | ICD-10-CM | POA: Diagnosis not present

## 2019-02-07 DIAGNOSIS — I739 Peripheral vascular disease, unspecified: Secondary | ICD-10-CM | POA: Diagnosis not present

## 2019-02-16 DIAGNOSIS — E7800 Pure hypercholesterolemia, unspecified: Secondary | ICD-10-CM | POA: Diagnosis not present

## 2019-02-16 DIAGNOSIS — F0390 Unspecified dementia without behavioral disturbance: Secondary | ICD-10-CM | POA: Diagnosis not present

## 2019-03-16 DIAGNOSIS — F0390 Unspecified dementia without behavioral disturbance: Secondary | ICD-10-CM | POA: Diagnosis not present

## 2019-03-16 DIAGNOSIS — E7800 Pure hypercholesterolemia, unspecified: Secondary | ICD-10-CM | POA: Diagnosis not present

## 2019-04-04 DIAGNOSIS — M25552 Pain in left hip: Secondary | ICD-10-CM | POA: Diagnosis not present

## 2019-04-04 DIAGNOSIS — M545 Low back pain: Secondary | ICD-10-CM | POA: Diagnosis not present

## 2019-04-04 DIAGNOSIS — F79 Unspecified intellectual disabilities: Secondary | ICD-10-CM | POA: Diagnosis not present

## 2019-04-04 DIAGNOSIS — Z6830 Body mass index (BMI) 30.0-30.9, adult: Secondary | ICD-10-CM | POA: Diagnosis not present

## 2019-04-04 DIAGNOSIS — S79912A Unspecified injury of left hip, initial encounter: Secondary | ICD-10-CM | POA: Diagnosis not present

## 2019-04-04 DIAGNOSIS — Y92009 Unspecified place in unspecified non-institutional (private) residence as the place of occurrence of the external cause: Secondary | ICD-10-CM | POA: Diagnosis not present

## 2019-04-04 DIAGNOSIS — W19XXXA Unspecified fall, initial encounter: Secondary | ICD-10-CM | POA: Diagnosis not present

## 2019-04-04 DIAGNOSIS — M47816 Spondylosis without myelopathy or radiculopathy, lumbar region: Secondary | ICD-10-CM | POA: Diagnosis not present

## 2019-04-04 DIAGNOSIS — Z299 Encounter for prophylactic measures, unspecified: Secondary | ICD-10-CM | POA: Diagnosis not present

## 2019-04-18 DIAGNOSIS — S8992XA Unspecified injury of left lower leg, initial encounter: Secondary | ICD-10-CM | POA: Diagnosis not present

## 2019-04-18 DIAGNOSIS — M79652 Pain in left thigh: Secondary | ICD-10-CM | POA: Diagnosis not present

## 2019-04-18 DIAGNOSIS — E7800 Pure hypercholesterolemia, unspecified: Secondary | ICD-10-CM | POA: Diagnosis not present

## 2019-04-18 DIAGNOSIS — Z6830 Body mass index (BMI) 30.0-30.9, adult: Secondary | ICD-10-CM | POA: Diagnosis not present

## 2019-04-18 DIAGNOSIS — S79922A Unspecified injury of left thigh, initial encounter: Secondary | ICD-10-CM | POA: Diagnosis not present

## 2019-04-18 DIAGNOSIS — Z9181 History of falling: Secondary | ICD-10-CM | POA: Diagnosis not present

## 2019-04-18 DIAGNOSIS — Z299 Encounter for prophylactic measures, unspecified: Secondary | ICD-10-CM | POA: Diagnosis not present

## 2019-04-18 DIAGNOSIS — Z789 Other specified health status: Secondary | ICD-10-CM | POA: Diagnosis not present

## 2019-04-18 DIAGNOSIS — M25562 Pain in left knee: Secondary | ICD-10-CM | POA: Diagnosis not present

## 2019-04-18 DIAGNOSIS — M79662 Pain in left lower leg: Secondary | ICD-10-CM | POA: Diagnosis not present

## 2019-04-18 DIAGNOSIS — F79 Unspecified intellectual disabilities: Secondary | ICD-10-CM | POA: Diagnosis not present

## 2019-04-18 DIAGNOSIS — W19XXXA Unspecified fall, initial encounter: Secondary | ICD-10-CM | POA: Diagnosis not present

## 2019-04-18 DIAGNOSIS — Y92009 Unspecified place in unspecified non-institutional (private) residence as the place of occurrence of the external cause: Secondary | ICD-10-CM | POA: Diagnosis not present

## 2019-04-19 DIAGNOSIS — Z23 Encounter for immunization: Secondary | ICD-10-CM | POA: Diagnosis not present

## 2019-04-21 DIAGNOSIS — E7800 Pure hypercholesterolemia, unspecified: Secondary | ICD-10-CM | POA: Diagnosis not present

## 2019-04-21 DIAGNOSIS — F0390 Unspecified dementia without behavioral disturbance: Secondary | ICD-10-CM | POA: Diagnosis not present

## 2019-04-26 DIAGNOSIS — I959 Hypotension, unspecified: Secondary | ICD-10-CM | POA: Diagnosis not present

## 2019-04-26 DIAGNOSIS — F329 Major depressive disorder, single episode, unspecified: Secondary | ICD-10-CM | POA: Diagnosis not present

## 2019-04-26 DIAGNOSIS — Z7401 Bed confinement status: Secondary | ICD-10-CM | POA: Diagnosis not present

## 2019-04-26 DIAGNOSIS — L03317 Cellulitis of buttock: Secondary | ICD-10-CM | POA: Diagnosis not present

## 2019-04-26 DIAGNOSIS — R0902 Hypoxemia: Secondary | ICD-10-CM | POA: Diagnosis not present

## 2019-04-26 DIAGNOSIS — F419 Anxiety disorder, unspecified: Secondary | ICD-10-CM | POA: Diagnosis not present

## 2019-04-26 DIAGNOSIS — L89302 Pressure ulcer of unspecified buttock, stage 2: Secondary | ICD-10-CM | POA: Diagnosis not present

## 2019-04-26 DIAGNOSIS — N9089 Other specified noninflammatory disorders of vulva and perineum: Secondary | ICD-10-CM | POA: Diagnosis not present

## 2019-04-26 DIAGNOSIS — R262 Difficulty in walking, not elsewhere classified: Secondary | ICD-10-CM | POA: Diagnosis not present

## 2019-04-26 DIAGNOSIS — W19XXXA Unspecified fall, initial encounter: Secondary | ICD-10-CM | POA: Diagnosis not present

## 2019-04-26 DIAGNOSIS — Z7983 Long term (current) use of bisphosphonates: Secondary | ICD-10-CM | POA: Diagnosis not present

## 2019-04-26 DIAGNOSIS — Z7982 Long term (current) use of aspirin: Secondary | ICD-10-CM | POA: Diagnosis not present

## 2019-04-26 DIAGNOSIS — S32592A Other specified fracture of left pubis, initial encounter for closed fracture: Secondary | ICD-10-CM | POA: Diagnosis not present

## 2019-04-26 DIAGNOSIS — K219 Gastro-esophageal reflux disease without esophagitis: Secondary | ICD-10-CM | POA: Diagnosis not present

## 2019-04-26 DIAGNOSIS — R41841 Cognitive communication deficit: Secondary | ICD-10-CM | POA: Diagnosis not present

## 2019-04-26 DIAGNOSIS — M6281 Muscle weakness (generalized): Secondary | ICD-10-CM | POA: Diagnosis not present

## 2019-04-26 DIAGNOSIS — L0390 Cellulitis, unspecified: Secondary | ICD-10-CM | POA: Diagnosis not present

## 2019-04-26 DIAGNOSIS — M1990 Unspecified osteoarthritis, unspecified site: Secondary | ICD-10-CM | POA: Diagnosis not present

## 2019-04-26 DIAGNOSIS — R29898 Other symptoms and signs involving the musculoskeletal system: Secondary | ICD-10-CM | POA: Diagnosis not present

## 2019-04-26 DIAGNOSIS — I739 Peripheral vascular disease, unspecified: Secondary | ICD-10-CM | POA: Diagnosis present

## 2019-04-26 DIAGNOSIS — Z20822 Contact with and (suspected) exposure to covid-19: Secondary | ICD-10-CM | POA: Diagnosis present

## 2019-04-26 DIAGNOSIS — R52 Pain, unspecified: Secondary | ICD-10-CM | POA: Diagnosis not present

## 2019-04-26 DIAGNOSIS — L89152 Pressure ulcer of sacral region, stage 2: Secondary | ICD-10-CM | POA: Diagnosis not present

## 2019-04-26 DIAGNOSIS — I10 Essential (primary) hypertension: Secondary | ICD-10-CM | POA: Diagnosis not present

## 2019-04-26 DIAGNOSIS — S3210XA Unspecified fracture of sacrum, initial encounter for closed fracture: Secondary | ICD-10-CM | POA: Diagnosis not present

## 2019-04-26 DIAGNOSIS — S32519D Fracture of superior rim of unspecified pubis, subsequent encounter for fracture with routine healing: Secondary | ICD-10-CM | POA: Diagnosis not present

## 2019-04-26 DIAGNOSIS — K644 Residual hemorrhoidal skin tags: Secondary | ICD-10-CM | POA: Diagnosis not present

## 2019-04-26 DIAGNOSIS — R4182 Altered mental status, unspecified: Secondary | ICD-10-CM | POA: Diagnosis not present

## 2019-04-26 DIAGNOSIS — S3210XD Unspecified fracture of sacrum, subsequent encounter for fracture with routine healing: Secondary | ICD-10-CM | POA: Diagnosis not present

## 2019-04-26 DIAGNOSIS — S30814A Abrasion of vagina and vulva, initial encounter: Secondary | ICD-10-CM | POA: Diagnosis not present

## 2019-04-26 DIAGNOSIS — S32111A Minimally displaced Zone I fracture of sacrum, initial encounter for closed fracture: Secondary | ICD-10-CM | POA: Diagnosis not present

## 2019-04-26 DIAGNOSIS — S32512A Fracture of superior rim of left pubis, initial encounter for closed fracture: Secondary | ICD-10-CM | POA: Diagnosis not present

## 2019-04-26 DIAGNOSIS — S32502A Unspecified fracture of left pubis, initial encounter for closed fracture: Secondary | ICD-10-CM | POA: Diagnosis not present

## 2019-04-26 DIAGNOSIS — R1313 Dysphagia, pharyngeal phase: Secondary | ICD-10-CM | POA: Diagnosis not present

## 2019-04-26 DIAGNOSIS — R0689 Other abnormalities of breathing: Secondary | ICD-10-CM | POA: Diagnosis not present

## 2019-04-26 DIAGNOSIS — F72 Severe intellectual disabilities: Secondary | ICD-10-CM | POA: Diagnosis not present

## 2019-04-26 DIAGNOSIS — M810 Age-related osteoporosis without current pathological fracture: Secondary | ICD-10-CM | POA: Diagnosis present

## 2019-05-01 DIAGNOSIS — F329 Major depressive disorder, single episode, unspecified: Secondary | ICD-10-CM | POA: Diagnosis not present

## 2019-05-01 DIAGNOSIS — S3210XA Unspecified fracture of sacrum, initial encounter for closed fracture: Secondary | ICD-10-CM | POA: Diagnosis not present

## 2019-05-01 DIAGNOSIS — F72 Severe intellectual disabilities: Secondary | ICD-10-CM | POA: Diagnosis not present

## 2019-05-01 DIAGNOSIS — F060 Psychotic disorder with hallucinations due to known physiological condition: Secondary | ICD-10-CM | POA: Diagnosis not present

## 2019-05-01 DIAGNOSIS — L03317 Cellulitis of buttock: Secondary | ICD-10-CM | POA: Diagnosis not present

## 2019-05-01 DIAGNOSIS — M1990 Unspecified osteoarthritis, unspecified site: Secondary | ICD-10-CM | POA: Diagnosis not present

## 2019-05-01 DIAGNOSIS — M6281 Muscle weakness (generalized): Secondary | ICD-10-CM | POA: Diagnosis not present

## 2019-05-01 DIAGNOSIS — I959 Hypotension, unspecified: Secondary | ICD-10-CM | POA: Diagnosis not present

## 2019-05-01 DIAGNOSIS — M1612 Unilateral primary osteoarthritis, left hip: Secondary | ICD-10-CM | POA: Diagnosis not present

## 2019-05-01 DIAGNOSIS — F419 Anxiety disorder, unspecified: Secondary | ICD-10-CM | POA: Diagnosis not present

## 2019-05-01 DIAGNOSIS — R262 Difficulty in walking, not elsewhere classified: Secondary | ICD-10-CM | POA: Diagnosis not present

## 2019-05-01 DIAGNOSIS — M8000XD Age-related osteoporosis with current pathological fracture, unspecified site, subsequent encounter for fracture with routine healing: Secondary | ICD-10-CM | POA: Diagnosis not present

## 2019-05-01 DIAGNOSIS — Z23 Encounter for immunization: Secondary | ICD-10-CM | POA: Diagnosis not present

## 2019-05-01 DIAGNOSIS — K219 Gastro-esophageal reflux disease without esophagitis: Secondary | ICD-10-CM | POA: Diagnosis not present

## 2019-05-01 DIAGNOSIS — Z7401 Bed confinement status: Secondary | ICD-10-CM | POA: Diagnosis not present

## 2019-05-01 DIAGNOSIS — R29898 Other symptoms and signs involving the musculoskeletal system: Secondary | ICD-10-CM | POA: Diagnosis not present

## 2019-05-01 DIAGNOSIS — M79652 Pain in left thigh: Secondary | ICD-10-CM | POA: Diagnosis not present

## 2019-05-01 DIAGNOSIS — M1991 Primary osteoarthritis, unspecified site: Secondary | ICD-10-CM | POA: Diagnosis not present

## 2019-05-01 DIAGNOSIS — M25551 Pain in right hip: Secondary | ICD-10-CM | POA: Diagnosis not present

## 2019-05-01 DIAGNOSIS — S3210XD Unspecified fracture of sacrum, subsequent encounter for fracture with routine healing: Secondary | ICD-10-CM | POA: Diagnosis not present

## 2019-05-01 DIAGNOSIS — L0390 Cellulitis, unspecified: Secondary | ICD-10-CM | POA: Diagnosis not present

## 2019-05-01 DIAGNOSIS — S3282XD Multiple fractures of pelvis without disruption of pelvic ring, subsequent encounter for fracture with routine healing: Secondary | ICD-10-CM | POA: Diagnosis not present

## 2019-05-01 DIAGNOSIS — S32519D Fracture of superior rim of unspecified pubis, subsequent encounter for fracture with routine healing: Secondary | ICD-10-CM | POA: Diagnosis not present

## 2019-05-01 DIAGNOSIS — S32111A Minimally displaced Zone I fracture of sacrum, initial encounter for closed fracture: Secondary | ICD-10-CM | POA: Diagnosis not present

## 2019-05-01 DIAGNOSIS — R41841 Cognitive communication deficit: Secondary | ICD-10-CM | POA: Diagnosis not present

## 2019-05-01 DIAGNOSIS — I739 Peripheral vascular disease, unspecified: Secondary | ICD-10-CM | POA: Diagnosis not present

## 2019-05-01 DIAGNOSIS — S32592A Other specified fracture of left pubis, initial encounter for closed fracture: Secondary | ICD-10-CM | POA: Diagnosis not present

## 2019-05-01 DIAGNOSIS — R1313 Dysphagia, pharyngeal phase: Secondary | ICD-10-CM | POA: Diagnosis not present

## 2019-05-01 DIAGNOSIS — F0391 Unspecified dementia with behavioral disturbance: Secondary | ICD-10-CM | POA: Diagnosis not present

## 2019-05-01 DIAGNOSIS — M810 Age-related osteoporosis without current pathological fracture: Secondary | ICD-10-CM | POA: Diagnosis not present

## 2019-05-04 DIAGNOSIS — M1991 Primary osteoarthritis, unspecified site: Secondary | ICD-10-CM | POA: Diagnosis not present

## 2019-05-04 DIAGNOSIS — S3282XD Multiple fractures of pelvis without disruption of pelvic ring, subsequent encounter for fracture with routine healing: Secondary | ICD-10-CM | POA: Diagnosis not present

## 2019-05-04 DIAGNOSIS — M8000XD Age-related osteoporosis with current pathological fracture, unspecified site, subsequent encounter for fracture with routine healing: Secondary | ICD-10-CM | POA: Diagnosis not present

## 2019-05-11 DIAGNOSIS — M25551 Pain in right hip: Secondary | ICD-10-CM | POA: Diagnosis not present

## 2019-05-22 ENCOUNTER — Other Ambulatory Visit: Payer: Self-pay | Admitting: *Deleted

## 2019-05-22 DIAGNOSIS — M6281 Muscle weakness (generalized): Secondary | ICD-10-CM | POA: Diagnosis not present

## 2019-05-22 DIAGNOSIS — R262 Difficulty in walking, not elsewhere classified: Secondary | ICD-10-CM | POA: Diagnosis not present

## 2019-05-22 DIAGNOSIS — R1313 Dysphagia, pharyngeal phase: Secondary | ICD-10-CM | POA: Diagnosis not present

## 2019-05-22 DIAGNOSIS — R41841 Cognitive communication deficit: Secondary | ICD-10-CM | POA: Diagnosis not present

## 2019-05-22 DIAGNOSIS — S32519D Fracture of superior rim of unspecified pubis, subsequent encounter for fracture with routine healing: Secondary | ICD-10-CM | POA: Diagnosis not present

## 2019-05-22 NOTE — Patient Outreach (Signed)
Member screened for potential [facility] Care Management needs as a benefit of [employer].  Verified with [facility] UM RN documentation, per facility, member transitioned to LTC at [facility] on 05/21/19.   No identifiable [facility] needs at this time.   Raiford Noble, MSN-Ed, RN,BSN [facility] Post Acute Care Coordinator [phone] [facility]) [phone]  (Toll free office)

## 2019-05-23 DIAGNOSIS — R41841 Cognitive communication deficit: Secondary | ICD-10-CM | POA: Diagnosis not present

## 2019-05-23 DIAGNOSIS — R262 Difficulty in walking, not elsewhere classified: Secondary | ICD-10-CM | POA: Diagnosis not present

## 2019-05-23 DIAGNOSIS — R1313 Dysphagia, pharyngeal phase: Secondary | ICD-10-CM | POA: Diagnosis not present

## 2019-05-23 DIAGNOSIS — S32519D Fracture of superior rim of unspecified pubis, subsequent encounter for fracture with routine healing: Secondary | ICD-10-CM | POA: Diagnosis not present

## 2019-05-23 DIAGNOSIS — M6281 Muscle weakness (generalized): Secondary | ICD-10-CM | POA: Diagnosis not present

## 2019-05-24 DIAGNOSIS — S3282XD Multiple fractures of pelvis without disruption of pelvic ring, subsequent encounter for fracture with routine healing: Secondary | ICD-10-CM | POA: Diagnosis not present

## 2019-05-24 DIAGNOSIS — R1313 Dysphagia, pharyngeal phase: Secondary | ICD-10-CM | POA: Diagnosis not present

## 2019-05-24 DIAGNOSIS — F09 Unspecified mental disorder due to known physiological condition: Secondary | ICD-10-CM | POA: Diagnosis not present

## 2019-05-24 DIAGNOSIS — R262 Difficulty in walking, not elsewhere classified: Secondary | ICD-10-CM | POA: Diagnosis not present

## 2019-05-24 DIAGNOSIS — S32519D Fracture of superior rim of unspecified pubis, subsequent encounter for fracture with routine healing: Secondary | ICD-10-CM | POA: Diagnosis not present

## 2019-05-24 DIAGNOSIS — M6281 Muscle weakness (generalized): Secondary | ICD-10-CM | POA: Diagnosis not present

## 2019-05-24 DIAGNOSIS — M8000XD Age-related osteoporosis with current pathological fracture, unspecified site, subsequent encounter for fracture with routine healing: Secondary | ICD-10-CM | POA: Diagnosis not present

## 2019-05-24 DIAGNOSIS — R41841 Cognitive communication deficit: Secondary | ICD-10-CM | POA: Diagnosis not present

## 2019-05-25 DIAGNOSIS — M6281 Muscle weakness (generalized): Secondary | ICD-10-CM | POA: Diagnosis not present

## 2019-05-25 DIAGNOSIS — F339 Major depressive disorder, recurrent, unspecified: Secondary | ICD-10-CM | POA: Diagnosis not present

## 2019-05-25 DIAGNOSIS — F060 Psychotic disorder with hallucinations due to known physiological condition: Secondary | ICD-10-CM | POA: Diagnosis not present

## 2019-05-25 DIAGNOSIS — R41841 Cognitive communication deficit: Secondary | ICD-10-CM | POA: Diagnosis not present

## 2019-05-25 DIAGNOSIS — F0391 Unspecified dementia with behavioral disturbance: Secondary | ICD-10-CM | POA: Diagnosis not present

## 2019-05-25 DIAGNOSIS — R262 Difficulty in walking, not elsewhere classified: Secondary | ICD-10-CM | POA: Diagnosis not present

## 2019-05-25 DIAGNOSIS — R1313 Dysphagia, pharyngeal phase: Secondary | ICD-10-CM | POA: Diagnosis not present

## 2019-05-25 DIAGNOSIS — S32519D Fracture of superior rim of unspecified pubis, subsequent encounter for fracture with routine healing: Secondary | ICD-10-CM | POA: Diagnosis not present

## 2019-05-26 DIAGNOSIS — R41841 Cognitive communication deficit: Secondary | ICD-10-CM | POA: Diagnosis not present

## 2019-05-26 DIAGNOSIS — S32519D Fracture of superior rim of unspecified pubis, subsequent encounter for fracture with routine healing: Secondary | ICD-10-CM | POA: Diagnosis not present

## 2019-05-26 DIAGNOSIS — R262 Difficulty in walking, not elsewhere classified: Secondary | ICD-10-CM | POA: Diagnosis not present

## 2019-05-26 DIAGNOSIS — M6281 Muscle weakness (generalized): Secondary | ICD-10-CM | POA: Diagnosis not present

## 2019-05-26 DIAGNOSIS — R1313 Dysphagia, pharyngeal phase: Secondary | ICD-10-CM | POA: Diagnosis not present

## 2019-05-27 DIAGNOSIS — I10 Essential (primary) hypertension: Secondary | ICD-10-CM | POA: Diagnosis not present

## 2019-05-27 DIAGNOSIS — D649 Anemia, unspecified: Secondary | ICD-10-CM | POA: Diagnosis not present

## 2019-05-29 DIAGNOSIS — R262 Difficulty in walking, not elsewhere classified: Secondary | ICD-10-CM | POA: Diagnosis not present

## 2019-05-29 DIAGNOSIS — M6281 Muscle weakness (generalized): Secondary | ICD-10-CM | POA: Diagnosis not present

## 2019-05-29 DIAGNOSIS — S32519D Fracture of superior rim of unspecified pubis, subsequent encounter for fracture with routine healing: Secondary | ICD-10-CM | POA: Diagnosis not present

## 2019-05-29 DIAGNOSIS — R41841 Cognitive communication deficit: Secondary | ICD-10-CM | POA: Diagnosis not present

## 2019-05-29 DIAGNOSIS — R1313 Dysphagia, pharyngeal phase: Secondary | ICD-10-CM | POA: Diagnosis not present

## 2019-05-30 DIAGNOSIS — M6281 Muscle weakness (generalized): Secondary | ICD-10-CM | POA: Diagnosis not present

## 2019-05-30 DIAGNOSIS — R262 Difficulty in walking, not elsewhere classified: Secondary | ICD-10-CM | POA: Diagnosis not present

## 2019-05-30 DIAGNOSIS — S32519D Fracture of superior rim of unspecified pubis, subsequent encounter for fracture with routine healing: Secondary | ICD-10-CM | POA: Diagnosis not present

## 2019-05-30 DIAGNOSIS — R1313 Dysphagia, pharyngeal phase: Secondary | ICD-10-CM | POA: Diagnosis not present

## 2019-05-30 DIAGNOSIS — R41841 Cognitive communication deficit: Secondary | ICD-10-CM | POA: Diagnosis not present

## 2019-05-31 DIAGNOSIS — M25551 Pain in right hip: Secondary | ICD-10-CM | POA: Diagnosis not present

## 2019-05-31 DIAGNOSIS — R1313 Dysphagia, pharyngeal phase: Secondary | ICD-10-CM | POA: Diagnosis not present

## 2019-05-31 DIAGNOSIS — R41841 Cognitive communication deficit: Secondary | ICD-10-CM | POA: Diagnosis not present

## 2019-05-31 DIAGNOSIS — M1991 Primary osteoarthritis, unspecified site: Secondary | ICD-10-CM | POA: Diagnosis not present

## 2019-05-31 DIAGNOSIS — M6281 Muscle weakness (generalized): Secondary | ICD-10-CM | POA: Diagnosis not present

## 2019-05-31 DIAGNOSIS — S32519D Fracture of superior rim of unspecified pubis, subsequent encounter for fracture with routine healing: Secondary | ICD-10-CM | POA: Diagnosis not present

## 2019-05-31 DIAGNOSIS — R262 Difficulty in walking, not elsewhere classified: Secondary | ICD-10-CM | POA: Diagnosis not present

## 2019-05-31 DIAGNOSIS — M8000XD Age-related osteoporosis with current pathological fracture, unspecified site, subsequent encounter for fracture with routine healing: Secondary | ICD-10-CM | POA: Diagnosis not present

## 2019-06-01 DIAGNOSIS — R262 Difficulty in walking, not elsewhere classified: Secondary | ICD-10-CM | POA: Diagnosis not present

## 2019-06-01 DIAGNOSIS — S32519D Fracture of superior rim of unspecified pubis, subsequent encounter for fracture with routine healing: Secondary | ICD-10-CM | POA: Diagnosis not present

## 2019-06-01 DIAGNOSIS — R41841 Cognitive communication deficit: Secondary | ICD-10-CM | POA: Diagnosis not present

## 2019-06-01 DIAGNOSIS — M6281 Muscle weakness (generalized): Secondary | ICD-10-CM | POA: Diagnosis not present

## 2019-06-01 DIAGNOSIS — R1313 Dysphagia, pharyngeal phase: Secondary | ICD-10-CM | POA: Diagnosis not present

## 2019-06-02 DIAGNOSIS — S32519D Fracture of superior rim of unspecified pubis, subsequent encounter for fracture with routine healing: Secondary | ICD-10-CM | POA: Diagnosis not present

## 2019-06-02 DIAGNOSIS — M6281 Muscle weakness (generalized): Secondary | ICD-10-CM | POA: Diagnosis not present

## 2019-06-02 DIAGNOSIS — R262 Difficulty in walking, not elsewhere classified: Secondary | ICD-10-CM | POA: Diagnosis not present

## 2019-06-02 DIAGNOSIS — R1313 Dysphagia, pharyngeal phase: Secondary | ICD-10-CM | POA: Diagnosis not present

## 2019-06-02 DIAGNOSIS — R41841 Cognitive communication deficit: Secondary | ICD-10-CM | POA: Diagnosis not present

## 2019-06-05 DIAGNOSIS — R41841 Cognitive communication deficit: Secondary | ICD-10-CM | POA: Diagnosis not present

## 2019-06-05 DIAGNOSIS — M6281 Muscle weakness (generalized): Secondary | ICD-10-CM | POA: Diagnosis not present

## 2019-06-05 DIAGNOSIS — R1313 Dysphagia, pharyngeal phase: Secondary | ICD-10-CM | POA: Diagnosis not present

## 2019-06-05 DIAGNOSIS — R262 Difficulty in walking, not elsewhere classified: Secondary | ICD-10-CM | POA: Diagnosis not present

## 2019-06-05 DIAGNOSIS — S32519D Fracture of superior rim of unspecified pubis, subsequent encounter for fracture with routine healing: Secondary | ICD-10-CM | POA: Diagnosis not present

## 2019-06-06 DIAGNOSIS — S32519D Fracture of superior rim of unspecified pubis, subsequent encounter for fracture with routine healing: Secondary | ICD-10-CM | POA: Diagnosis not present

## 2019-06-06 DIAGNOSIS — R262 Difficulty in walking, not elsewhere classified: Secondary | ICD-10-CM | POA: Diagnosis not present

## 2019-06-06 DIAGNOSIS — R41841 Cognitive communication deficit: Secondary | ICD-10-CM | POA: Diagnosis not present

## 2019-06-06 DIAGNOSIS — M6281 Muscle weakness (generalized): Secondary | ICD-10-CM | POA: Diagnosis not present

## 2019-06-06 DIAGNOSIS — R1313 Dysphagia, pharyngeal phase: Secondary | ICD-10-CM | POA: Diagnosis not present

## 2019-06-07 DIAGNOSIS — R262 Difficulty in walking, not elsewhere classified: Secondary | ICD-10-CM | POA: Diagnosis not present

## 2019-06-07 DIAGNOSIS — R1313 Dysphagia, pharyngeal phase: Secondary | ICD-10-CM | POA: Diagnosis not present

## 2019-06-07 DIAGNOSIS — S32519D Fracture of superior rim of unspecified pubis, subsequent encounter for fracture with routine healing: Secondary | ICD-10-CM | POA: Diagnosis not present

## 2019-06-07 DIAGNOSIS — S3282XD Multiple fractures of pelvis without disruption of pelvic ring, subsequent encounter for fracture with routine healing: Secondary | ICD-10-CM | POA: Diagnosis not present

## 2019-06-07 DIAGNOSIS — M6281 Muscle weakness (generalized): Secondary | ICD-10-CM | POA: Diagnosis not present

## 2019-06-07 DIAGNOSIS — M1991 Primary osteoarthritis, unspecified site: Secondary | ICD-10-CM | POA: Diagnosis not present

## 2019-06-07 DIAGNOSIS — M25551 Pain in right hip: Secondary | ICD-10-CM | POA: Diagnosis not present

## 2019-06-07 DIAGNOSIS — R41841 Cognitive communication deficit: Secondary | ICD-10-CM | POA: Diagnosis not present

## 2019-06-07 DIAGNOSIS — M8000XD Age-related osteoporosis with current pathological fracture, unspecified site, subsequent encounter for fracture with routine healing: Secondary | ICD-10-CM | POA: Diagnosis not present

## 2019-06-08 DIAGNOSIS — S32519D Fracture of superior rim of unspecified pubis, subsequent encounter for fracture with routine healing: Secondary | ICD-10-CM | POA: Diagnosis not present

## 2019-06-08 DIAGNOSIS — M6281 Muscle weakness (generalized): Secondary | ICD-10-CM | POA: Diagnosis not present

## 2019-06-08 DIAGNOSIS — R1313 Dysphagia, pharyngeal phase: Secondary | ICD-10-CM | POA: Diagnosis not present

## 2019-06-08 DIAGNOSIS — R262 Difficulty in walking, not elsewhere classified: Secondary | ICD-10-CM | POA: Diagnosis not present

## 2019-06-08 DIAGNOSIS — R41841 Cognitive communication deficit: Secondary | ICD-10-CM | POA: Diagnosis not present

## 2019-06-09 DIAGNOSIS — S32519D Fracture of superior rim of unspecified pubis, subsequent encounter for fracture with routine healing: Secondary | ICD-10-CM | POA: Diagnosis not present

## 2019-06-09 DIAGNOSIS — R262 Difficulty in walking, not elsewhere classified: Secondary | ICD-10-CM | POA: Diagnosis not present

## 2019-06-09 DIAGNOSIS — M6281 Muscle weakness (generalized): Secondary | ICD-10-CM | POA: Diagnosis not present

## 2019-06-09 DIAGNOSIS — R1313 Dysphagia, pharyngeal phase: Secondary | ICD-10-CM | POA: Diagnosis not present

## 2019-06-09 DIAGNOSIS — R41841 Cognitive communication deficit: Secondary | ICD-10-CM | POA: Diagnosis not present

## 2019-06-12 DIAGNOSIS — R262 Difficulty in walking, not elsewhere classified: Secondary | ICD-10-CM | POA: Diagnosis not present

## 2019-06-12 DIAGNOSIS — R41841 Cognitive communication deficit: Secondary | ICD-10-CM | POA: Diagnosis not present

## 2019-06-12 DIAGNOSIS — S32519D Fracture of superior rim of unspecified pubis, subsequent encounter for fracture with routine healing: Secondary | ICD-10-CM | POA: Diagnosis not present

## 2019-06-12 DIAGNOSIS — M6281 Muscle weakness (generalized): Secondary | ICD-10-CM | POA: Diagnosis not present

## 2019-06-12 DIAGNOSIS — R1313 Dysphagia, pharyngeal phase: Secondary | ICD-10-CM | POA: Diagnosis not present

## 2019-06-13 DIAGNOSIS — S32519D Fracture of superior rim of unspecified pubis, subsequent encounter for fracture with routine healing: Secondary | ICD-10-CM | POA: Diagnosis not present

## 2019-06-13 DIAGNOSIS — R1313 Dysphagia, pharyngeal phase: Secondary | ICD-10-CM | POA: Diagnosis not present

## 2019-06-13 DIAGNOSIS — M6281 Muscle weakness (generalized): Secondary | ICD-10-CM | POA: Diagnosis not present

## 2019-06-13 DIAGNOSIS — R41841 Cognitive communication deficit: Secondary | ICD-10-CM | POA: Diagnosis not present

## 2019-06-13 DIAGNOSIS — R262 Difficulty in walking, not elsewhere classified: Secondary | ICD-10-CM | POA: Diagnosis not present

## 2019-06-14 DIAGNOSIS — R262 Difficulty in walking, not elsewhere classified: Secondary | ICD-10-CM | POA: Diagnosis not present

## 2019-06-14 DIAGNOSIS — R1313 Dysphagia, pharyngeal phase: Secondary | ICD-10-CM | POA: Diagnosis not present

## 2019-06-14 DIAGNOSIS — M6281 Muscle weakness (generalized): Secondary | ICD-10-CM | POA: Diagnosis not present

## 2019-06-14 DIAGNOSIS — S3282XD Multiple fractures of pelvis without disruption of pelvic ring, subsequent encounter for fracture with routine healing: Secondary | ICD-10-CM | POA: Diagnosis not present

## 2019-06-14 DIAGNOSIS — M1991 Primary osteoarthritis, unspecified site: Secondary | ICD-10-CM | POA: Diagnosis not present

## 2019-06-14 DIAGNOSIS — M8000XD Age-related osteoporosis with current pathological fracture, unspecified site, subsequent encounter for fracture with routine healing: Secondary | ICD-10-CM | POA: Diagnosis not present

## 2019-06-14 DIAGNOSIS — F09 Unspecified mental disorder due to known physiological condition: Secondary | ICD-10-CM | POA: Diagnosis not present

## 2019-06-14 DIAGNOSIS — R41841 Cognitive communication deficit: Secondary | ICD-10-CM | POA: Diagnosis not present

## 2019-06-14 DIAGNOSIS — S32519D Fracture of superior rim of unspecified pubis, subsequent encounter for fracture with routine healing: Secondary | ICD-10-CM | POA: Diagnosis not present

## 2019-06-15 DIAGNOSIS — R41841 Cognitive communication deficit: Secondary | ICD-10-CM | POA: Diagnosis not present

## 2019-06-15 DIAGNOSIS — F060 Psychotic disorder with hallucinations due to known physiological condition: Secondary | ICD-10-CM | POA: Diagnosis not present

## 2019-06-15 DIAGNOSIS — R262 Difficulty in walking, not elsewhere classified: Secondary | ICD-10-CM | POA: Diagnosis not present

## 2019-06-15 DIAGNOSIS — M6281 Muscle weakness (generalized): Secondary | ICD-10-CM | POA: Diagnosis not present

## 2019-06-15 DIAGNOSIS — R1313 Dysphagia, pharyngeal phase: Secondary | ICD-10-CM | POA: Diagnosis not present

## 2019-06-15 DIAGNOSIS — S32519D Fracture of superior rim of unspecified pubis, subsequent encounter for fracture with routine healing: Secondary | ICD-10-CM | POA: Diagnosis not present

## 2019-06-15 DIAGNOSIS — F339 Major depressive disorder, recurrent, unspecified: Secondary | ICD-10-CM | POA: Diagnosis not present

## 2019-06-15 DIAGNOSIS — F0391 Unspecified dementia with behavioral disturbance: Secondary | ICD-10-CM | POA: Diagnosis not present

## 2019-06-16 DIAGNOSIS — R262 Difficulty in walking, not elsewhere classified: Secondary | ICD-10-CM | POA: Diagnosis not present

## 2019-06-16 DIAGNOSIS — S32519D Fracture of superior rim of unspecified pubis, subsequent encounter for fracture with routine healing: Secondary | ICD-10-CM | POA: Diagnosis not present

## 2019-06-16 DIAGNOSIS — R1313 Dysphagia, pharyngeal phase: Secondary | ICD-10-CM | POA: Diagnosis not present

## 2019-06-16 DIAGNOSIS — M6281 Muscle weakness (generalized): Secondary | ICD-10-CM | POA: Diagnosis not present

## 2019-06-16 DIAGNOSIS — R41841 Cognitive communication deficit: Secondary | ICD-10-CM | POA: Diagnosis not present

## 2019-06-19 DIAGNOSIS — R262 Difficulty in walking, not elsewhere classified: Secondary | ICD-10-CM | POA: Diagnosis not present

## 2019-06-19 DIAGNOSIS — S32519D Fracture of superior rim of unspecified pubis, subsequent encounter for fracture with routine healing: Secondary | ICD-10-CM | POA: Diagnosis not present

## 2019-06-19 DIAGNOSIS — M6281 Muscle weakness (generalized): Secondary | ICD-10-CM | POA: Diagnosis not present

## 2019-06-19 DIAGNOSIS — R1313 Dysphagia, pharyngeal phase: Secondary | ICD-10-CM | POA: Diagnosis not present

## 2019-06-19 DIAGNOSIS — R41841 Cognitive communication deficit: Secondary | ICD-10-CM | POA: Diagnosis not present

## 2019-06-20 DIAGNOSIS — R41841 Cognitive communication deficit: Secondary | ICD-10-CM | POA: Diagnosis not present

## 2019-06-20 DIAGNOSIS — R262 Difficulty in walking, not elsewhere classified: Secondary | ICD-10-CM | POA: Diagnosis not present

## 2019-06-20 DIAGNOSIS — R1313 Dysphagia, pharyngeal phase: Secondary | ICD-10-CM | POA: Diagnosis not present

## 2019-06-20 DIAGNOSIS — S32519D Fracture of superior rim of unspecified pubis, subsequent encounter for fracture with routine healing: Secondary | ICD-10-CM | POA: Diagnosis not present

## 2019-06-20 DIAGNOSIS — M6281 Muscle weakness (generalized): Secondary | ICD-10-CM | POA: Diagnosis not present

## 2019-06-21 DIAGNOSIS — R41841 Cognitive communication deficit: Secondary | ICD-10-CM | POA: Diagnosis not present

## 2019-06-21 DIAGNOSIS — R262 Difficulty in walking, not elsewhere classified: Secondary | ICD-10-CM | POA: Diagnosis not present

## 2019-06-21 DIAGNOSIS — R1313 Dysphagia, pharyngeal phase: Secondary | ICD-10-CM | POA: Diagnosis not present

## 2019-06-21 DIAGNOSIS — S32519D Fracture of superior rim of unspecified pubis, subsequent encounter for fracture with routine healing: Secondary | ICD-10-CM | POA: Diagnosis not present

## 2019-06-21 DIAGNOSIS — M6281 Muscle weakness (generalized): Secondary | ICD-10-CM | POA: Diagnosis not present

## 2019-06-22 DIAGNOSIS — S32519D Fracture of superior rim of unspecified pubis, subsequent encounter for fracture with routine healing: Secondary | ICD-10-CM | POA: Diagnosis not present

## 2019-06-22 DIAGNOSIS — R262 Difficulty in walking, not elsewhere classified: Secondary | ICD-10-CM | POA: Diagnosis not present

## 2019-06-22 DIAGNOSIS — M6281 Muscle weakness (generalized): Secondary | ICD-10-CM | POA: Diagnosis not present

## 2019-06-22 DIAGNOSIS — R41841 Cognitive communication deficit: Secondary | ICD-10-CM | POA: Diagnosis not present

## 2019-06-22 DIAGNOSIS — R1313 Dysphagia, pharyngeal phase: Secondary | ICD-10-CM | POA: Diagnosis not present

## 2019-06-23 DIAGNOSIS — R41841 Cognitive communication deficit: Secondary | ICD-10-CM | POA: Diagnosis not present

## 2019-06-23 DIAGNOSIS — M6281 Muscle weakness (generalized): Secondary | ICD-10-CM | POA: Diagnosis not present

## 2019-06-23 DIAGNOSIS — R1313 Dysphagia, pharyngeal phase: Secondary | ICD-10-CM | POA: Diagnosis not present

## 2019-06-23 DIAGNOSIS — R262 Difficulty in walking, not elsewhere classified: Secondary | ICD-10-CM | POA: Diagnosis not present

## 2019-06-23 DIAGNOSIS — S32519D Fracture of superior rim of unspecified pubis, subsequent encounter for fracture with routine healing: Secondary | ICD-10-CM | POA: Diagnosis not present

## 2019-06-24 DIAGNOSIS — R262 Difficulty in walking, not elsewhere classified: Secondary | ICD-10-CM | POA: Diagnosis not present

## 2019-06-24 DIAGNOSIS — R41841 Cognitive communication deficit: Secondary | ICD-10-CM | POA: Diagnosis not present

## 2019-06-24 DIAGNOSIS — M6281 Muscle weakness (generalized): Secondary | ICD-10-CM | POA: Diagnosis not present

## 2019-06-24 DIAGNOSIS — S32519D Fracture of superior rim of unspecified pubis, subsequent encounter for fracture with routine healing: Secondary | ICD-10-CM | POA: Diagnosis not present

## 2019-06-24 DIAGNOSIS — R1313 Dysphagia, pharyngeal phase: Secondary | ICD-10-CM | POA: Diagnosis not present

## 2019-06-26 DIAGNOSIS — R1313 Dysphagia, pharyngeal phase: Secondary | ICD-10-CM | POA: Diagnosis not present

## 2019-06-26 DIAGNOSIS — R262 Difficulty in walking, not elsewhere classified: Secondary | ICD-10-CM | POA: Diagnosis not present

## 2019-06-26 DIAGNOSIS — R41841 Cognitive communication deficit: Secondary | ICD-10-CM | POA: Diagnosis not present

## 2019-06-26 DIAGNOSIS — M6281 Muscle weakness (generalized): Secondary | ICD-10-CM | POA: Diagnosis not present

## 2019-06-26 DIAGNOSIS — S32519D Fracture of superior rim of unspecified pubis, subsequent encounter for fracture with routine healing: Secondary | ICD-10-CM | POA: Diagnosis not present

## 2019-06-27 DIAGNOSIS — R1313 Dysphagia, pharyngeal phase: Secondary | ICD-10-CM | POA: Diagnosis not present

## 2019-06-27 DIAGNOSIS — M6281 Muscle weakness (generalized): Secondary | ICD-10-CM | POA: Diagnosis not present

## 2019-06-27 DIAGNOSIS — R41841 Cognitive communication deficit: Secondary | ICD-10-CM | POA: Diagnosis not present

## 2019-06-27 DIAGNOSIS — S32519D Fracture of superior rim of unspecified pubis, subsequent encounter for fracture with routine healing: Secondary | ICD-10-CM | POA: Diagnosis not present

## 2019-06-27 DIAGNOSIS — R262 Difficulty in walking, not elsewhere classified: Secondary | ICD-10-CM | POA: Diagnosis not present

## 2019-06-28 DIAGNOSIS — S32519D Fracture of superior rim of unspecified pubis, subsequent encounter for fracture with routine healing: Secondary | ICD-10-CM | POA: Diagnosis not present

## 2019-06-28 DIAGNOSIS — R262 Difficulty in walking, not elsewhere classified: Secondary | ICD-10-CM | POA: Diagnosis not present

## 2019-06-28 DIAGNOSIS — R41841 Cognitive communication deficit: Secondary | ICD-10-CM | POA: Diagnosis not present

## 2019-06-28 DIAGNOSIS — R1313 Dysphagia, pharyngeal phase: Secondary | ICD-10-CM | POA: Diagnosis not present

## 2019-06-28 DIAGNOSIS — M6281 Muscle weakness (generalized): Secondary | ICD-10-CM | POA: Diagnosis not present

## 2019-06-29 DIAGNOSIS — R262 Difficulty in walking, not elsewhere classified: Secondary | ICD-10-CM | POA: Diagnosis not present

## 2019-06-29 DIAGNOSIS — M6281 Muscle weakness (generalized): Secondary | ICD-10-CM | POA: Diagnosis not present

## 2019-06-29 DIAGNOSIS — S32519D Fracture of superior rim of unspecified pubis, subsequent encounter for fracture with routine healing: Secondary | ICD-10-CM | POA: Diagnosis not present

## 2019-06-29 DIAGNOSIS — R41841 Cognitive communication deficit: Secondary | ICD-10-CM | POA: Diagnosis not present

## 2019-06-29 DIAGNOSIS — R1313 Dysphagia, pharyngeal phase: Secondary | ICD-10-CM | POA: Diagnosis not present

## 2019-06-30 DIAGNOSIS — M6281 Muscle weakness (generalized): Secondary | ICD-10-CM | POA: Diagnosis not present

## 2019-06-30 DIAGNOSIS — R262 Difficulty in walking, not elsewhere classified: Secondary | ICD-10-CM | POA: Diagnosis not present

## 2019-06-30 DIAGNOSIS — R1313 Dysphagia, pharyngeal phase: Secondary | ICD-10-CM | POA: Diagnosis not present

## 2019-06-30 DIAGNOSIS — R41841 Cognitive communication deficit: Secondary | ICD-10-CM | POA: Diagnosis not present

## 2019-06-30 DIAGNOSIS — S32519D Fracture of superior rim of unspecified pubis, subsequent encounter for fracture with routine healing: Secondary | ICD-10-CM | POA: Diagnosis not present

## 2019-07-03 DIAGNOSIS — S32519D Fracture of superior rim of unspecified pubis, subsequent encounter for fracture with routine healing: Secondary | ICD-10-CM | POA: Diagnosis not present

## 2019-07-03 DIAGNOSIS — M6281 Muscle weakness (generalized): Secondary | ICD-10-CM | POA: Diagnosis not present

## 2019-07-03 DIAGNOSIS — R262 Difficulty in walking, not elsewhere classified: Secondary | ICD-10-CM | POA: Diagnosis not present

## 2019-07-03 DIAGNOSIS — R41841 Cognitive communication deficit: Secondary | ICD-10-CM | POA: Diagnosis not present

## 2019-07-03 DIAGNOSIS — R1313 Dysphagia, pharyngeal phase: Secondary | ICD-10-CM | POA: Diagnosis not present

## 2019-07-06 DIAGNOSIS — F060 Psychotic disorder with hallucinations due to known physiological condition: Secondary | ICD-10-CM | POA: Diagnosis not present

## 2019-07-06 DIAGNOSIS — F0391 Unspecified dementia with behavioral disturbance: Secondary | ICD-10-CM | POA: Diagnosis not present

## 2019-07-06 DIAGNOSIS — F339 Major depressive disorder, recurrent, unspecified: Secondary | ICD-10-CM | POA: Diagnosis not present

## 2019-07-12 DIAGNOSIS — M8000XD Age-related osteoporosis with current pathological fracture, unspecified site, subsequent encounter for fracture with routine healing: Secondary | ICD-10-CM | POA: Diagnosis not present

## 2019-07-12 DIAGNOSIS — S3282XD Multiple fractures of pelvis without disruption of pelvic ring, subsequent encounter for fracture with routine healing: Secondary | ICD-10-CM | POA: Diagnosis not present

## 2019-07-12 DIAGNOSIS — M25551 Pain in right hip: Secondary | ICD-10-CM | POA: Diagnosis not present

## 2019-07-12 DIAGNOSIS — M1991 Primary osteoarthritis, unspecified site: Secondary | ICD-10-CM | POA: Diagnosis not present

## 2019-07-13 DIAGNOSIS — F060 Psychotic disorder with hallucinations due to known physiological condition: Secondary | ICD-10-CM | POA: Diagnosis not present

## 2019-07-13 DIAGNOSIS — F339 Major depressive disorder, recurrent, unspecified: Secondary | ICD-10-CM | POA: Diagnosis not present

## 2019-07-13 DIAGNOSIS — F0391 Unspecified dementia with behavioral disturbance: Secondary | ICD-10-CM | POA: Diagnosis not present

## 2019-07-27 DIAGNOSIS — R404 Transient alteration of awareness: Secondary | ICD-10-CM | POA: Diagnosis not present

## 2019-07-27 DIAGNOSIS — R5381 Other malaise: Secondary | ICD-10-CM | POA: Diagnosis not present

## 2019-07-27 DIAGNOSIS — R1310 Dysphagia, unspecified: Secondary | ICD-10-CM | POA: Diagnosis not present

## 2019-07-27 DIAGNOSIS — J690 Pneumonitis due to inhalation of food and vomit: Secondary | ICD-10-CM | POA: Diagnosis not present

## 2019-07-27 DIAGNOSIS — R911 Solitary pulmonary nodule: Secondary | ICD-10-CM | POA: Diagnosis not present

## 2019-07-27 DIAGNOSIS — R0902 Hypoxemia: Secondary | ICD-10-CM | POA: Diagnosis not present

## 2019-07-27 DIAGNOSIS — I959 Hypotension, unspecified: Secondary | ICD-10-CM | POA: Diagnosis not present

## 2019-07-27 DIAGNOSIS — R410 Disorientation, unspecified: Secondary | ICD-10-CM | POA: Diagnosis not present

## 2019-07-27 DIAGNOSIS — R52 Pain, unspecified: Secondary | ICD-10-CM | POA: Diagnosis not present

## 2019-07-27 DIAGNOSIS — J90 Pleural effusion, not elsewhere classified: Secondary | ICD-10-CM | POA: Diagnosis not present

## 2019-07-27 DIAGNOSIS — Z7401 Bed confinement status: Secondary | ICD-10-CM | POA: Diagnosis not present

## 2019-07-28 DIAGNOSIS — R1310 Dysphagia, unspecified: Secondary | ICD-10-CM | POA: Diagnosis not present

## 2019-07-28 DIAGNOSIS — I959 Hypotension, unspecified: Secondary | ICD-10-CM | POA: Diagnosis not present

## 2019-07-28 DIAGNOSIS — Z7401 Bed confinement status: Secondary | ICD-10-CM | POA: Diagnosis not present

## 2019-07-28 DIAGNOSIS — R410 Disorientation, unspecified: Secondary | ICD-10-CM | POA: Diagnosis not present

## 2019-07-31 ENCOUNTER — Other Ambulatory Visit (HOSPITAL_COMMUNITY): Payer: Self-pay

## 2019-07-31 DIAGNOSIS — R1310 Dysphagia, unspecified: Secondary | ICD-10-CM

## 2019-08-01 ENCOUNTER — Other Ambulatory Visit (HOSPITAL_COMMUNITY): Payer: Self-pay | Admitting: Internal Medicine

## 2019-08-01 DIAGNOSIS — R1310 Dysphagia, unspecified: Secondary | ICD-10-CM

## 2019-08-02 DIAGNOSIS — Z79899 Other long term (current) drug therapy: Secondary | ICD-10-CM | POA: Diagnosis not present

## 2019-08-02 DIAGNOSIS — R410 Disorientation, unspecified: Secondary | ICD-10-CM | POA: Diagnosis not present

## 2019-08-02 DIAGNOSIS — F259 Schizoaffective disorder, unspecified: Secondary | ICD-10-CM | POA: Diagnosis not present

## 2019-08-02 DIAGNOSIS — R404 Transient alteration of awareness: Secondary | ICD-10-CM | POA: Diagnosis not present

## 2019-08-02 DIAGNOSIS — J189 Pneumonia, unspecified organism: Secondary | ICD-10-CM | POA: Diagnosis not present

## 2019-08-02 DIAGNOSIS — E860 Dehydration: Secondary | ICD-10-CM | POA: Diagnosis not present

## 2019-08-02 DIAGNOSIS — Z7982 Long term (current) use of aspirin: Secondary | ICD-10-CM | POA: Diagnosis not present

## 2019-08-02 DIAGNOSIS — Z7983 Long term (current) use of bisphosphonates: Secondary | ICD-10-CM | POA: Diagnosis not present

## 2019-08-02 DIAGNOSIS — N390 Urinary tract infection, site not specified: Secondary | ICD-10-CM | POA: Diagnosis present

## 2019-08-02 DIAGNOSIS — R627 Adult failure to thrive: Secondary | ICD-10-CM | POA: Diagnosis present

## 2019-08-02 DIAGNOSIS — Z6823 Body mass index (BMI) 23.0-23.9, adult: Secondary | ICD-10-CM | POA: Diagnosis not present

## 2019-08-02 DIAGNOSIS — Z7401 Bed confinement status: Secondary | ICD-10-CM | POA: Diagnosis not present

## 2019-08-02 DIAGNOSIS — I739 Peripheral vascular disease, unspecified: Secondary | ICD-10-CM | POA: Diagnosis not present

## 2019-08-02 DIAGNOSIS — Z9049 Acquired absence of other specified parts of digestive tract: Secondary | ICD-10-CM | POA: Diagnosis not present

## 2019-08-02 DIAGNOSIS — F0390 Unspecified dementia without behavioral disturbance: Secondary | ICD-10-CM | POA: Diagnosis present

## 2019-08-02 DIAGNOSIS — Z20822 Contact with and (suspected) exposure to covid-19: Secondary | ICD-10-CM | POA: Diagnosis not present

## 2019-08-02 DIAGNOSIS — E870 Hyperosmolality and hypernatremia: Secondary | ICD-10-CM | POA: Diagnosis not present

## 2019-08-02 DIAGNOSIS — I469 Cardiac arrest, cause unspecified: Secondary | ICD-10-CM | POA: Diagnosis not present

## 2019-08-03 DIAGNOSIS — R569 Unspecified convulsions: Secondary | ICD-10-CM

## 2019-08-03 HISTORY — DX: Unspecified convulsions: R56.9

## 2019-08-04 ENCOUNTER — Other Ambulatory Visit (HOSPITAL_COMMUNITY): Payer: Self-pay | Admitting: Internal Medicine

## 2019-08-04 ENCOUNTER — Other Ambulatory Visit: Payer: Self-pay

## 2019-08-04 ENCOUNTER — Ambulatory Visit (HOSPITAL_COMMUNITY)
Admission: RE | Admit: 2019-08-04 | Discharge: 2019-08-04 | Disposition: A | Discharge status: Home / Self Care | Payer: Medicare Other | Source: Ambulatory Visit | Attending: Internal Medicine | Admitting: Internal Medicine

## 2019-08-04 DIAGNOSIS — R1310 Dysphagia, unspecified: Secondary | ICD-10-CM | POA: Diagnosis present

## 2019-08-14 DIAGNOSIS — R41841 Cognitive communication deficit: Secondary | ICD-10-CM | POA: Diagnosis not present

## 2019-08-14 DIAGNOSIS — I739 Peripheral vascular disease, unspecified: Secondary | ICD-10-CM | POA: Diagnosis not present

## 2019-08-14 DIAGNOSIS — Z7401 Bed confinement status: Secondary | ICD-10-CM | POA: Diagnosis not present

## 2019-08-14 DIAGNOSIS — S3210XD Unspecified fracture of sacrum, subsequent encounter for fracture with routine healing: Secondary | ICD-10-CM | POA: Diagnosis not present

## 2019-08-14 DIAGNOSIS — K219 Gastro-esophageal reflux disease without esophagitis: Secondary | ICD-10-CM | POA: Diagnosis not present

## 2019-08-14 DIAGNOSIS — M810 Age-related osteoporosis without current pathological fracture: Secondary | ICD-10-CM | POA: Diagnosis not present

## 2019-08-14 DIAGNOSIS — E559 Vitamin D deficiency, unspecified: Secondary | ICD-10-CM | POA: Diagnosis not present

## 2019-08-14 DIAGNOSIS — I959 Hypotension, unspecified: Secondary | ICD-10-CM | POA: Diagnosis not present

## 2019-08-14 DIAGNOSIS — F329 Major depressive disorder, single episode, unspecified: Secondary | ICD-10-CM | POA: Diagnosis not present

## 2019-08-14 DIAGNOSIS — J690 Pneumonitis due to inhalation of food and vomit: Secondary | ICD-10-CM | POA: Diagnosis not present

## 2019-08-14 DIAGNOSIS — E870 Hyperosmolality and hypernatremia: Secondary | ICD-10-CM | POA: Diagnosis not present

## 2019-08-14 DIAGNOSIS — Z48815 Encounter for surgical aftercare following surgery on the digestive system: Secondary | ICD-10-CM | POA: Diagnosis not present

## 2019-08-14 DIAGNOSIS — R627 Adult failure to thrive: Secondary | ICD-10-CM | POA: Diagnosis not present

## 2019-08-14 DIAGNOSIS — Z931 Gastrostomy status: Secondary | ICD-10-CM | POA: Diagnosis not present

## 2019-08-14 DIAGNOSIS — F72 Severe intellectual disabilities: Secondary | ICD-10-CM | POA: Diagnosis not present

## 2019-08-14 DIAGNOSIS — R404 Transient alteration of awareness: Secondary | ICD-10-CM | POA: Diagnosis not present

## 2019-08-14 DIAGNOSIS — I10 Essential (primary) hypertension: Secondary | ICD-10-CM | POA: Diagnosis not present

## 2019-08-14 DIAGNOSIS — R1313 Dysphagia, pharyngeal phase: Secondary | ICD-10-CM | POA: Diagnosis not present

## 2019-08-14 DIAGNOSIS — D649 Anemia, unspecified: Secondary | ICD-10-CM | POA: Diagnosis not present

## 2019-08-14 DIAGNOSIS — R0602 Shortness of breath: Secondary | ICD-10-CM | POA: Diagnosis not present

## 2019-08-14 DIAGNOSIS — M1990 Unspecified osteoarthritis, unspecified site: Secondary | ICD-10-CM | POA: Diagnosis not present

## 2019-08-14 DIAGNOSIS — F259 Schizoaffective disorder, unspecified: Secondary | ICD-10-CM | POA: Diagnosis not present

## 2019-08-14 DIAGNOSIS — F0391 Unspecified dementia with behavioral disturbance: Secondary | ICD-10-CM | POA: Diagnosis not present

## 2019-08-14 DIAGNOSIS — G4089 Other seizures: Secondary | ICD-10-CM | POA: Diagnosis not present

## 2019-08-14 DIAGNOSIS — Z79899 Other long term (current) drug therapy: Secondary | ICD-10-CM | POA: Diagnosis not present

## 2019-08-14 DIAGNOSIS — F419 Anxiety disorder, unspecified: Secondary | ICD-10-CM | POA: Diagnosis not present

## 2019-08-24 DIAGNOSIS — F411 Generalized anxiety disorder: Secondary | ICD-10-CM | POA: Diagnosis not present

## 2019-08-24 DIAGNOSIS — F0391 Unspecified dementia with behavioral disturbance: Secondary | ICD-10-CM | POA: Diagnosis not present

## 2019-08-24 DIAGNOSIS — F339 Major depressive disorder, recurrent, unspecified: Secondary | ICD-10-CM | POA: Diagnosis not present

## 2019-08-24 DIAGNOSIS — F060 Psychotic disorder with hallucinations due to known physiological condition: Secondary | ICD-10-CM | POA: Diagnosis not present

## 2019-10-16 ENCOUNTER — Telehealth: Payer: Self-pay | Admitting: Neurology

## 2019-10-16 NOTE — Telephone Encounter (Signed)
Hey, can you all help with this? This is a brand new pt, never been seen at our office

## 2019-10-16 NOTE — Telephone Encounter (Signed)
Pt's Social Worker/Guardian, [name] called, not able to accompany Pt on her appt to complete the paperwork. [name] asking if paperwork could be sent or can be done verbally? Would like a call from the nurse.

## 2019-10-18 ENCOUNTER — Encounter: Payer: Self-pay | Admitting: Neurology

## 2019-10-18 ENCOUNTER — Other Ambulatory Visit: Payer: Self-pay

## 2019-10-18 ENCOUNTER — Ambulatory Visit (INDEPENDENT_AMBULATORY_CARE_PROVIDER_SITE_OTHER): Payer: Medicare Other | Admitting: Neurology

## 2019-10-18 VITALS — BP 92/49 | HR 75 | Ht 63.0 in | Wt 125.2 lb

## 2019-10-18 DIAGNOSIS — G40909 Epilepsy, unspecified, not intractable, without status epilepticus: Secondary | ICD-10-CM | POA: Insufficient documentation

## 2019-10-18 DIAGNOSIS — R29898 Other symptoms and signs involving the musculoskeletal system: Secondary | ICD-10-CM | POA: Diagnosis not present

## 2019-10-18 DIAGNOSIS — F209 Schizophrenia, unspecified: Secondary | ICD-10-CM | POA: Insufficient documentation

## 2019-10-18 DIAGNOSIS — R1310 Dysphagia, unspecified: Secondary | ICD-10-CM | POA: Diagnosis not present

## 2019-10-18 NOTE — Progress Notes (Signed)
[facility]  PATIENT: Jenna Frey DOB: 01/01/1941  REFERRING DOCTOR OR PCP: Wende Crease, MD SOURCE: Patient, notes from primary care/[facility], imaging and lab reports, CT of the brain personally reviewed.  _________________________________   HISTORICAL  CHIEF COMPLAINT:  Chief Complaint  Patient presents with  . New Patient (Initial Visit)    pt with a caretaker from the facility [facility] in [place], [place]. presents today because they think she had a sz that was tramadol induced. this has occured the one time (they are aware of) tramadol was dc'd. she is 90% bed bound due to unable to stand. unable to swallow placed a PEG tube. she can verbalize some.     HISTORY OF PRESENT ILLNESS:  I had the pleasure of seeing your patient, Jenna Frey, at [facility] for neurologic consultation regarding her seizure.  She is a 79 year old woman with schizophrenia who had the first seizure of her life 08/11/2019.  She is institutionalized and is a ward of the state.   She had swallowing difficulty and was considered to be malnourished.  She was having reduced po intake and was admitted to the hospital for failure to thrive and dysphagia.  The day before PEG tube placement was scheduled, she had a seizure.  At the time, she had been on tramadol.  Laboratory test from 08/11/2019 show a reduced potassium of 2.4, BUN 43, potassium 5, normal magnesium, reduced protein and albumin (albumin only 2.0), normal liver function tests.  She was placed on Keppra but that was discontinued as the seizures were felt to be due to the malnutrition and tramadol.  The tramadol was discontinued.  She is now getting nutrition through a feeding tube.  She has not had any more seizures.   Medical review shows she is also on Zyprexa and it may lower the seizure threshold.    She sleeps well getting up to eight hours most nights.     She is wheelchair bound this year and was using a  walker for a while before that. She feels generally weak.   She has contractures now in the legs and feet.  She has good use of her hands..     CT Scan 08/12/2019:  IMPRESSION:  1. No acute intracranial abnormality.  2. Chronic microvascular ischemia and generalized atrophy.  3. Chronic left ostiomeatal complex sinusitis.   I personally reviewed the images and agree with the official interpretation.  Specifically, there are no acute findings.   REVIEW OF SYSTEMS: Constitutional: Nutrition as above Eyes: No visual changes, double vision, eye pain Ear, nose and throat: No hearing loss, ear pain, nasal congestion, sore throat Cardiovascular: No chest pain, palpitations Respiratory: No shortness of breath at rest or with exertion.   No wheezes GastrointestinaI: No nausea, vomiting, diarrhea, abdominal pain, fecal incontinence.  She has a feeding tube. Genitourinary: No dysuria, urinary retention or frequency.  No nocturia. Musculoskeletal: No neck pain, back pain Integumentary: No rash, pruritus, skin lesions Neurological: as above Psychiatric: Has history of schizophrenia Endocrine: No palpitations, diaphoresis, change in appetite, change in weigh or increased thirst Hematologic/Lymphatic: No anemia, purpura, petechiae. Allergic/Immunologic: No itchy/runny eyes, nasal congestion, recent allergic reactions, rashes  ALLERGIES: Allergies  Allergen Reactions  . Tramadol Other (See Comments)    ? Cause of seizure    HOME MEDICATIONS:  Current Outpatient Medications:  .  acetaminophen (TYLENOL) 325 MG tablet, Place 650 mg into feeding tube every 6 (six) hours as needed., Disp: , Rfl:  .  alendronate (FOSAMAX) 70 MG/75ML solution, Place into feeding tube., Disp: , Rfl:  .  ascorbic acid (VITAMIN C) 500 MG/5ML syrup, Place 500 mg into feeding tube daily., Disp: , Rfl:  .  aspirin 81 MG EC tablet, Take by mouth., Disp: , Rfl:  .  Chelated Zinc 50 MG TABS, Place 1 tablet into feeding  tube daily., Disp: , Rfl:  .  cholecalciferol (VITAMIN D) 25 MCG (1000 UNIT) tablet, SMARTSIG:1 Tablet(s) Gastro Tube Every Morning, Disp: , Rfl:  .  furosemide (LASIX) 20 MG tablet, Take 20 mg by mouth daily., Disp: , Rfl:  .  HYDROcodone-acetaminophen (NORCO) 5-325 MG tablet, Place 1 tablet into feeding tube every 6 (six) hours as needed for moderate pain., Disp: , Rfl:  .  Multiple Vitamin (MULTI-VITAMIN) tablet, Take 1 tablet by mouth daily., Disp: , Rfl:  .  omeprazole (PRILOSEC) 20 MG capsule, , Disp: , Rfl:  .  OYSTER SHELL CALCIUM PO, Place 500 mg into feeding tube daily., Disp: , Rfl:  .  polyethylene glycol powder (GLYCOLAX/MIRALAX) 17 GM/SCOOP powder, Take by mouth., Disp: , Rfl:  .  potassium chloride 20 MEQ/15ML (10%) SOLN, SMARTSIG:7.5 Milliliter(s) Gastro Tube Every Morning, Disp: , Rfl:  .  senna (SENOKOT) 8.6 MG tablet, Take 1 tablet by mouth daily., Disp: , Rfl:  .  sertraline (ZOLOFT) 100 MG tablet, Take 100 mg by mouth daily., Disp: , Rfl:  .  ZYPREXA 7.5 MG tablet, SMARTSIG:1 Tablet(s) By Mouth Every Evening, Disp: , Rfl:   PAST MEDICAL HISTORY: Past Medical History:  Diagnosis Date  . Anxiety   . Dementia (HCC)   . Failure to thrive (0-17)   . GERD (gastroesophageal reflux disease)   . Peripheral vascular disease (HCC)   . Schizoaffective disorder (HCC)     PAST SURGICAL HISTORY:   FAMILY HISTORY: No family history on file.  SOCIAL HISTORY:  Social History   Socioeconomic History  . Marital status: Widowed    Spouse name: Not on file  . Number of children: Not on file  . Years of education: Not on file  . Highest education level: Not on file  Occupational History  . Not on file  Tobacco Use  . Smoking status: Never Smoker  . Smokeless tobacco: Never Used  Substance and Sexual Activity  . Alcohol use: Not Currently  . Drug use: Not Currently  . Sexual activity: Not on file  Other Topics Concern  . Not on file  Social History Narrative  . Not on  file   Social Determinants of Health   Financial Resource Strain:   . Difficulty of Paying Living Expenses: Not on file  Food Insecurity:   . Worried About Programme researcher, broadcasting/film/video in the Last Year: Not on file  . Ran Out of Food in the Last Year: Not on file  Transportation Needs:   . Lack of Transportation (Medical): Not on file  . Lack of Transportation (Non-Medical): Not on file  Physical Activity:   . Days of Exercise per Week: Not on file  . Minutes of Exercise per Session: Not on file  Stress:   . Feeling of Stress : Not on file  Social Connections:   . Frequency of Communication with Friends and Family: Not on file  . Frequency of Social Gatherings with Friends and Family: Not on file  . Attends Religious Services: Not on file  . Active Member of Clubs or Organizations: Not on file  . Attends Banker Meetings: Not  on file  . Marital Status: Not on file  Intimate Partner Violence:   . Fear of Current or Ex-Partner: Not on file  . Emotionally Abused: Not on file  . Physically Abused: Not on file  . Sexually Abused: Not on file     PHYSICAL EXAM  Vitals:   10/18/19 1015  BP: (!) 92/49  Pulse: 75  Weight: 125 lb 3.5 oz (56.8 kg)  Height: 5\' 3"  (1.6 m)    Body mass index is 22.18 kg/m.   General: The patient is well-developed and well-nourished and in no acute distress.  She is in a wheelchair with braces at the knees.  HEENT:  Head is [place]/AT.  Sclera are anicteric.    Neck: No carotid bruits are noted.  The neck is nontender.  Cardiovascular: The heart has a regular rate and rhythm with a normal S1 and S2. There were no murmurs, gallops or rubs.    Skin: Extremities are without rash.  Mild ankle edema..   Neurologic Exam  Mental status: The patient is alert and oriented x 3 at the time of the examination. The patient has apparent normal recent and remote memory, with an apparently normal attention span and concentration ability.   Speech is  normal.  Cranial nerves: Extraocular movements are full.  Facial strength is normal.. No obvious hearing deficits are noted.  Motor:  Muscle bulk is normal.   Increased muscle tone, legs much more than arms.  Contractures at the knee and ankles and toes.. Strength is  5 / 5 in the arms but reduce in legs (4 -/5 proximally)  Sensory: Sensory testing is essentially intact to pinprick, soft touch and vibration sensation in arms and knees  Coordination: Cerebellar testing shows slow but accurate finger-nose-finger and she is unable to do heel-to-shin bilaterally.  Gait and station: She is wheelchair bound with contractures so Station and gait cannot be tested.  Reflexes: Deep tendon reflexes are symmetric and normal bilaterally.   Plantar responses are flexor.      ASSESSMENT AND PLAN  Seizure disorder (HCC) - Plan: EEG adult  Schizophrenia, unspecified type (HCC)  Dysphagia, unspecified type  Weakness of both lower extremities   In summary, Ms. Golz is a 79 year old woman with schizophrenia who had a recent hospitalization for dysphagia and failure to thrive and PEG tube placement.  While in the hospital, she had a witnessed generalized tonic-clonic seizure.  At that time, she did have some metabolic derangements and also had been on tramadol.  I recommend that we get an EEG.  Hopefully this will be able to be performed closer to where she lives to make transportation easier.  If the EEG is normal or just shows generalized slowing, I do recommend that she remain off of the antiepileptic agent.  However, if she has focal slowing or spikes or sharp waves, then I would recommend that she go back on levetiracetam or another AED.    I did not schedule a follow-up but I would be happy to see her back if she has new or worsening neurologic symptoms.  Thank you for asking me to see Ms. Shoun.  Please let me know if I can be of further assistance with her or other patients in the  future.   Yazmyn Valbuena A. Teola Bradley, MD, [facility] 10/18/2019, 6:09 PM Certified in Neurology, Clinical Neurophysiology, Sleep Medicine and Neuroimaging  [facility] [address], [address] [place], [place] [phone]

## 2019-10-23 ENCOUNTER — Ambulatory Visit: Payer: Medicare Other | Admitting: Gastroenterology

## 2019-10-24 ENCOUNTER — Telehealth: Payer: Self-pay | Admitting: Neurology

## 2019-10-24 NOTE — Telephone Encounter (Signed)
Jenna Frey called from the [facility] and wanted EEG order faxed to [phone] . Dr. Epimenio Foot ok with patient having EEG their . [phone] . Order faxed.

## 2019-10-31 ENCOUNTER — Ambulatory Visit (INDEPENDENT_AMBULATORY_CARE_PROVIDER_SITE_OTHER): Payer: Medicare Other | Admitting: Neurology

## 2019-10-31 DIAGNOSIS — G40909 Epilepsy, unspecified, not intractable, without status epilepticus: Secondary | ICD-10-CM

## 2019-11-11 NOTE — Progress Notes (Signed)
   [facility]  EEG (ELECTROENCEPHALOGRAM) REPORT   STUDY DATE: 10/31/2019 PATIENT NAME: Jenna Frey DOB: 02-Aug-1940 MRN: [id number]  ORDERING CLINICIAN: Natsuko Kelsay A. Epimenio Foot, MD. PhD  TECHNOLOGIST: Elvis Coil, RPSGT TECHNIQUE: Electroencephalogram was recorded utilizing standard 10-20 system of lead placement and reformatted into average and bipolar montages.  RECORDING TIME: 22 minutes 45 seconds  CLINICAL INFORMATION: 79 year old woman with schizophrenia and seizures  FINDINGS: A digital EEG was performed while the patient was awake and drowsy. While awake and most alert there was a 7-8 hz posterior dominant rhythm. Voltages and frequencies were symmetric.  There were no focal, lateralizing, epileptiform activity or seizures seen.  Photic stimulation had a normal driving response. Hyperventilation and recovery did not change the underlying rhythms.  The patient became drowsy but did not fall asleep.  IMPRESSION: This EEG while the patient was awake and drowsy showed generalized slowing which could be seen with degenerative, metabolic or toxic etiology.  There was no epileptiform activity.   INTERPRETING PHYSICIAN:   Anne Sebring A. Epimenio Foot, MD, PhD, [facility] Certified in Neurology, Clinical Neurophysiology, Sleep Medicine, Pain Medicine and Neuroimaging  [facility] [address] [place], [place] [address] [phone]

## 2019-11-13 ENCOUNTER — Telehealth: Payer: Self-pay | Admitting: *Deleted

## 2019-11-13 NOTE — Telephone Encounter (Signed)
Faxed EEG results to [facility] in [place] at [phone]. Received fax confirmation.

## 2019-11-13 NOTE — Telephone Encounter (Signed)
-----   Message from Asa Lente, MD sent at 11/11/2019 10:55 AM EDT ----- Regarding: EEG Please let the caregiver know that the EEG did not show any seizures.    Likely the tramadol played some role in inducing a seizure.  She should stay off of tramadol.  If another seizure occurs we would need to consider adding an antiepileptic agent.

## 2019-11-21 ENCOUNTER — Encounter: Payer: Self-pay | Admitting: Gastroenterology

## 2019-11-21 NOTE — Progress Notes (Signed)
Referring Provider: [facility]  Primary Care Physician:  Timmie Foerster, MD Primary Gastroenterologist:  Dr. Jena Gauss  Chief Complaint  Patient presents with  . Dysphagia    feeding tube placed in July.     HPI:   Jenna Frey is a 79 y.o. female presenting today at the request of the [facility] for dysphagia.  History of dementia, GERD, peripheral vascular disease, schizoaffective disorder, anxiety, failure to thrive, and single episode of seizure activity in the setting of tramadol during recent hospitalization.  Patient was hospitalized  In June 2021 with hypernatremia in the setting of dehydration and failure to thrive.  She had previously been brought to the emergency room for PEG tube placement.  This was discussed with Dr. Olegario Messier with general surgery who recommended outpatient swallow study and calorie count prior to proceeding with PEG tube.  Hypernatremia was corrected.   Barium swallow completed 08/04/2019: Study was very limited due to patient's inability to stand or communicate well.  She was unable to swallow on command.  Exam performed in the LPO position.  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  Soft wave terminating in the mid esophagus suggesting esophageal dysmotility, tracheal aspiration on the first swallow.  States although the appearance may be due to narrowing along the neck of the hiatal hernia, cannot exclude a filling defect along the greater curvature of the stomach.  Hospitalized 08/11/2019-08/14/2019 at [facility] with dysphagia and failure to thrive with plans for PEG tube placement which was placed 08/11/2019.  She underwent PEG tube replacement 08/29/2019.  Today:  Presents today with [name], Guardian, who provides all information. Patient tried to give some information, but I can understand very little that she is trying  to convey.   [facility] Previously in family care home in [place] for 17 years. Started having falls, so ended up at [facility]. Started having trouble swallowing in June 2021. Guardian states she was losing weight very quickly/drastically.  Speech therapy at [facility] told [name] that they had to do something immediately as the patient wasn't able to eat or drink. Had tried thickened liquids and various food textures and patient was not able to tolerate anything by mouth. States any time she had anything by mouth, she would cough and gargle.  This led to multiple emergency room visits.  General surgeon, Dr. Marcha Solders, recommended barium swallow prior to PEG tube placement.  Once that was completed, she went to the hospital to have PEG tube placed and has been doing fairly well since.  She has been gaining weight.  She continues to be n[facility]o. at the [facility]. Reattempted water recently and she had the same symptoms.  I was able to understand patient say that she kept getting choked.  Per review of medication records from the nursing home, it appears patient was on metronidazole in July for aspiration pneumonia. She has been on 2L supplemental oxygen via nasal cannula since July.  [name] states that patient has conveyed to them that she has had her esophagus stretched many years ago, so they wanted to be seen by GI to see if this was a reasonable option that would help the patient.  [name] states that patient has had no other obvious GI complaints.  She does not complain of abdominal pain, nausea, vomiting, heartburn, constipation, or diarrhea.  [name] states that she is not aware of patient having any blood in the stool or any black stools.  No blood in the stool or black stools.   Past Medical History:  Diagnosis Date  . Anxiety   . Dementia (HCC)   . Failure to thrive (0-17)   . GERD (gastroesophageal reflux disease)   . Peripheral vascular disease  (HCC)   . Schizoaffective disorder (HCC)   . Seizure (HCC) 08/2019   x1 in the setting of tramadol.    Past Surgical History:  Procedure Laterality Date  . APPENDECTOMY    . breast biopsy Left   . CHOLECYSTECTOMY    . DILATION AND CURETTAGE, DIAGNOSTIC / THERAPEUTIC    . PEG PLACEMENT      Current Outpatient Medications  Medication Sig Dispense Refill  . acetaminophen (TYLENOL) 325 MG tablet Place 650 mg into feeding tube every 6 (six) hours as needed.    Marland Kitchen alendronate (FOSAMAX) 70 MG/75ML solution Place into feeding tube every 7 (seven) days.     Marland Kitchen aspirin 81 MG EC tablet 81 mg daily. Via peg    . cholecalciferol (VITAMIN D) 25 MCG (1000 UNIT) tablet SMARTSIG:1 Tablet(s) Gastro Tube Every Morning    . furosemide (LASIX) 20 MG tablet Place 20 mg into feeding tube daily. Via peg    . HYDROcodone-acetaminophen (NORCO) 5-325 MG tablet Place 1 tablet into feeding tube every 6 (six) hours as needed for moderate pain.    Marland Kitchen LORazepam (ATIVAN) 0.5 MG tablet 0.5 mg in the morning, at noon, in the evening, and at bedtime. Via tube    . Nutritional Supplements (FEEDING SUPPLEMENT, JEVITY 1.2 CAL,) LIQD Place 250 mLs into feeding tube. Twice a day    . omeprazole (PRILOSEC) 20 MG capsule 20 mg daily. Via g tube    . OXYGEN Inhale into the lungs. 2 liters cont    . OYSTER SHELL CALCIUM PO Place 500 mg into feeding tube daily.    . potassium chloride 20 MEQ/15ML (10%) SOLN SMARTSIG:7.5 Milliliter(s) Gastro Tube Every Morning    . senna (SENOKOT) 8.6 MG tablet 1 tablet daily. Via G tube    . sertraline (ZOLOFT) 100 MG tablet 100 mg daily. Via g tube    . ZYPREXA 7.5 MG tablet SMARTSIG:1 Tablet(s) By Mouth Every Evening    . polyethylene glycol powder (GLYCOLAX/MIRALAX) 17 GM/SCOOP powder Take by mouth. (Patient not taking: Reported on 11/22/2019)     No current facility-administered medications for this visit.    Allergies as of 11/22/2019 - Review Complete 11/22/2019  Allergen Reaction Noted    . Tramadol Other (See Comments) 08/29/2019    Family History  Problem Relation Age of Onset  . Colon cancer Neg Hx        information obatined from patient. Not sure if reliable.   . Esophageal cancer Neg Hx        obatined from patient. Not sure if reliable  . Stomach cancer Neg Hx        obatined from patient. Not sure if reliable    Social History   Socioeconomic History  . Marital status: Widowed    Spouse name: Not on file  . Number of children: Not on file  . Years of education: Not on file  . Highest education level: Not on file  Occupational History  . Not on file  Tobacco Use  . Smoking status: Never Smoker  . Smokeless tobacco: Never Used  Substance and Sexual Activity  .  Alcohol use: Not Currently  . Drug use: Not Currently  . Sexual activity: Not on file  Other Topics Concern  . Not on file  Social History Narrative  . Not on file   Social Determinants of Health   Financial Resource Strain:   . Difficulty of Paying Living Expenses: Not on file  Food Insecurity:   . Worried About Programme researcher, broadcasting/film/video in the Last Year: Not on file  . Ran Out of Food in the Last Year: Not on file  Transportation Needs:   . Lack of Transportation (Medical): Not on file  . Lack of Transportation (Non-Medical): Not on file  Physical Activity:   . Days of Exercise per Week: Not on file  . Minutes of Exercise per Session: Not on file  Stress:   . Feeling of Stress : Not on file  Social Connections:   . Frequency of Communication with Friends and Family: Not on file  . Frequency of Social Gatherings with Friends and Family: Not on file  . Attends Religious Services: Not on file  . Active Member of Clubs or Organizations: Not on file  . Attends Banker Meetings: Not on file  . Marital Status: Not on file  Intimate Partner Violence:   . Fear of Current or Ex-Partner: Not on file  . Emotionally Abused: Not on file  . Physically Abused: Not on file  . Sexually  Abused: Not on file    Review of Systems: Primarily obtained from guardian.  Limited information obtained from patient. Gen: Denies any fever, chills, cold or flulike symptoms. CV: Denies chest pain or heart palpitations. Resp: Denies shortness of breath.  No cough now that she is NPO. GI: See HPI Heme: See HPI  Physical Exam: BP 104/63   Pulse 65   Temp (!) 97.3 F (36.3 C)  General:   Alert. Oriented to self. Pleasant and cooperative. No acute distress.  In a wheelchair.  Has right leg contracture.  Nasal cannula in place. Head:  Normocephalic and atraumatic. Eyes:  Without icterus, sclera clear and conjunctiva pink.  Ears:  Normal auditory acuity. Lungs: Anterior lung fields and upper lung fields in the back are clear.  Was not able to get patient to lean forward in her wheelchair to be able to listen to lower lung fields.  No wheezes, rales, or rhonchi. No distress.  Heart:  S1, S2 present without murmurs appreciated.  Abdomen: Exam is limited as patient remains in a wheelchair.  +BS, soft, and non-distended.  Minimal tenderness to deep palpation in the RLQ of her abdomen.  Patient states she has no pain unless I press on this area.  No guarding or rebound. No masses appreciated.  Feeding tube in place. Rectal:  Deferred  Msk: Right leg contracture. Extremities:  Without edema. Neurologic:  Alert and  oriented self Skin:  Intact without significant lesions or rashes. Psych: Normal mood and affect.  Total Time: 45 minutes. About 50% of this encounter was spent discussing risk and benefits of procedures.

## 2019-11-22 ENCOUNTER — Encounter: Payer: Self-pay | Admitting: Gastroenterology

## 2019-11-22 ENCOUNTER — Ambulatory Visit (INDEPENDENT_AMBULATORY_CARE_PROVIDER_SITE_OTHER): Payer: Medicare Other | Admitting: Gastroenterology

## 2019-11-22 ENCOUNTER — Other Ambulatory Visit: Payer: Self-pay

## 2019-11-22 VITALS — BP 104/63 | HR 65 | Temp 97.3°F

## 2019-11-22 DIAGNOSIS — R1310 Dysphagia, unspecified: Secondary | ICD-10-CM

## 2019-11-22 NOTE — Assessment & Plan Note (Addendum)
79 year old female currently residing at the [facility] with history of schizoaffective disorder, dementia, failure to thrive, GERD, PVD, aspiration pneumonia in July 2021 remaining on supplemental oxygen, and recently found to have multifactorial dysphagia s/p placement of feeding tube in July 2021.  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  States although the appearance may be due to narrowing along the neck of the hiatal hernia, cannot exclude filling defect along the greater curvature of the stomach.  Also noted that exam is very limited due to patient's inability to stand or communicate well and inability to swallow on command.  Per disucssion with guardian today, patient had evaluation with speech therapy at the [facility] and she is NPO due to inability to swallow safely. Various textures and liquid consistencies were attempted but patient repeatedly had coughing/gargling.   Discussed that patient's dysphagia is multifactorial. While she may have a distal esophageal ring, I think her primary/limiting factor to eating/drinking is oropharyngeal dysphagia with aspiration. At this point, I feel the risk of EGD is greater than the benefit. Patient may also have to be intubated were she to undergo an EGD due to aspiration risk. Tried to discuss this with patient, but I am not sure she understands and I was not able to understand her communication efforts. Patients guardian agrees that the risk likely outweight the benefit and states if intubation were to be required, this would have to go back to their team to discuss risk vs benefit as well.   The other question is the inability to rule out a filling defect along the greater curvature of the stomach. I asked Ms. Ross what her thoughts would be for the patient if she were to be diagnosed  with cancer. [name] states she would have to discuss this with their team to determine if pursuing cancer treatment would be in the patient's best interest.   At this point, we are holding off on procedures as I feel risk outweigh benefit. [name] is going to discuss with her team further. I let Ms. Ross know I would also run this case by Dr. Jena Gauss.

## 2019-11-22 NOTE — Patient Instructions (Signed)
As we discussed, I feel risk out weight benefit for upper endoscopy. I will also talk with Dr. Jena Gauss about this.   It was nice to meet you today!   We will plan to follow-up with you as needed.   Ermalinda Memos, PA-C [facility]

## 2019-11-23 ENCOUNTER — Encounter: Payer: Self-pay | Admitting: Gastroenterology

## 2019-11-24 ENCOUNTER — Telehealth: Payer: Self-pay | Admitting: Gastroenterology

## 2019-11-24 NOTE — Telephone Encounter (Signed)
I have discussed case with Dr. Jena Gauss. He recommends we get the PEG tube procedure note for review prior to making recommendations on possible EGD.   Helmut Muster: If you could update [name] [name] that would be great. Darl Pikes: Please request PEG tube procedure note from [facility].

## 2019-11-27 NOTE — Telephone Encounter (Signed)
Noted. Spoke with pts [facility] Nurse and notified her of Dr. Jeanella Flattery recommendations of reviewing the Peg Tube notes prior to recommendations on possible EGD.

## 2019-11-27 NOTE — Telephone Encounter (Signed)
Spoke with Jenna Frey, she also is aware that Dr. Jena Gauss will review the Peg Tube notes prior to making recommendations for a possible EGD.

## 2019-11-27 NOTE — Telephone Encounter (Signed)
Requested records from [facility]. 

## 2019-11-28 NOTE — Progress Notes (Signed)
CC'ED TO PCP 

## 2019-12-01 NOTE — Telephone Encounter (Signed)
Jenna Frey, I received documentation for G-tube replacement completed 7/27. I need the op-note for original PEG tube placement on 08/11/19.

## 2019-12-05 NOTE — Telephone Encounter (Signed)
requested

## 2019-12-11 DIAGNOSIS — E46 Unspecified protein-calorie malnutrition: Secondary | ICD-10-CM | POA: Diagnosis not present

## 2019-12-11 DIAGNOSIS — F259 Schizoaffective disorder, unspecified: Secondary | ICD-10-CM | POA: Diagnosis not present

## 2019-12-11 DIAGNOSIS — R1310 Dysphagia, unspecified: Secondary | ICD-10-CM | POA: Diagnosis not present

## 2019-12-11 DIAGNOSIS — F0390 Unspecified dementia without behavioral disturbance: Secondary | ICD-10-CM | POA: Diagnosis not present

## 2019-12-11 DIAGNOSIS — I739 Peripheral vascular disease, unspecified: Secondary | ICD-10-CM | POA: Diagnosis not present

## 2019-12-11 DIAGNOSIS — R569 Unspecified convulsions: Secondary | ICD-10-CM | POA: Diagnosis not present

## 2019-12-11 DIAGNOSIS — M810 Age-related osteoporosis without current pathological fracture: Secondary | ICD-10-CM | POA: Diagnosis not present

## 2019-12-11 DIAGNOSIS — M1990 Unspecified osteoarthritis, unspecified site: Secondary | ICD-10-CM | POA: Diagnosis not present

## 2019-12-18 ENCOUNTER — Telehealth: Payer: Self-pay | Admitting: Gastroenterology

## 2019-12-18 NOTE — Telephone Encounter (Signed)
Lmom, waiting on a return call.  

## 2019-12-18 NOTE — Telephone Encounter (Signed)
Jenna Frey, please let [name] [name] know that we have received patients PEG tube procedure note. An EGD was performed to place the PEG tube placement and there was no mention of any concerning findings within the stomach to account for the questionable filling defect along the greater curvature of the stomach that was mentioned on prior BPE. I discussed this with Dr. Jena Gauss who recommended no further evaluation at this time.   In regards to the esophageal ring noted on prior BPE, Dr. Jena Gauss agreed with recommendations made at her office visit. EGD with esophageal dilation would not provide any meaningful benefit as her oropharyngeal dysphagia with potential for aspiration would persist. Stated if oropharyngeal dysphagia were to improve with documentation of a follow-up swallow study, then we could revisit esophageal dilation.

## 2019-12-19 NOTE — Telephone Encounter (Signed)
Spoke with [place]. She was notified of Ermalinda Memos, [place] recommendations. Pts AVS and recommendations was sent to [place] for her records.

## 2020-01-31 DIAGNOSIS — Z23 Encounter for immunization: Secondary | ICD-10-CM | POA: Diagnosis not present

## 2020-05-03 DEATH — deceased

## 2021-05-08 IMAGING — RF DG ESOPHAGUS
9 series · 13 of 24 positions shown · non-contrast
Comparison: Chest radiograph 08/02/2019

CLINICAL DATA: Dysphagia.

EXAM:
ESOPHOGRAM/BARIUM SWALLOW
TECHNIQUE: Single contrast examination was performed using  thin barium.
FLUOROSCOPY TIME:  Fluoroscopy Time:  2 minutes, 12 seconds
Radiation Exposure Index (if provided by the fluoroscopic device):
15.4 mGy
Number of Acquired Spot Images: 0

[Series 1: cp_standard · 0.18mm/px · 2 of 40 frames shown (1 of 9)]
[frame 6/40]
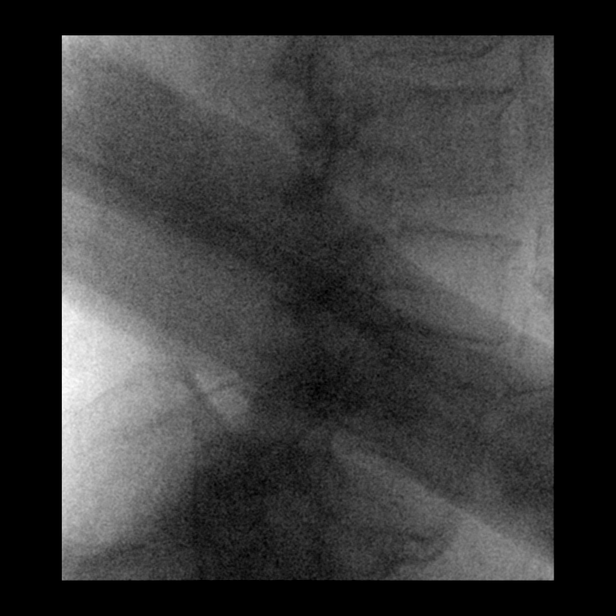
[frame 35/40]
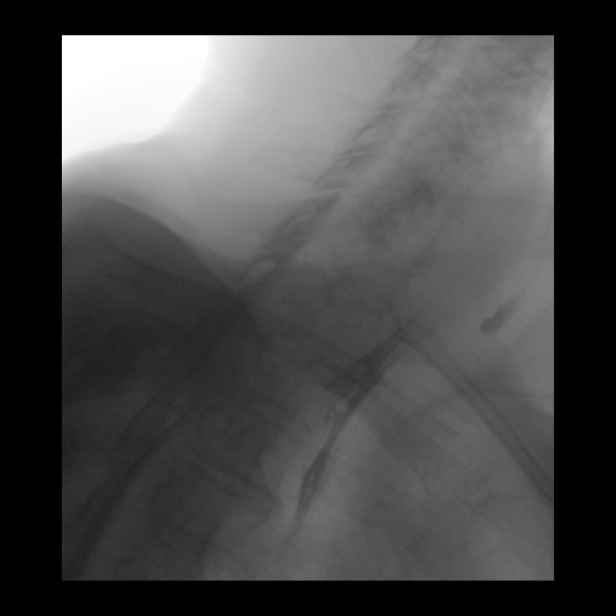

[Series 2: cp_standard · 0.18mm/px · 1 of 17 frames shown (2 of 9)]
[frame 15/17]
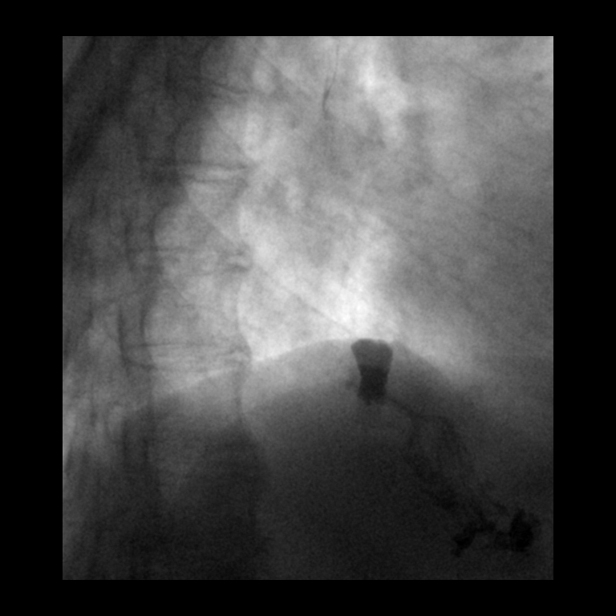

[Series 3: cp_standard · 0.18mm/px · 2 of 31 frames shown (3 of 9)]
[frame 16/31]
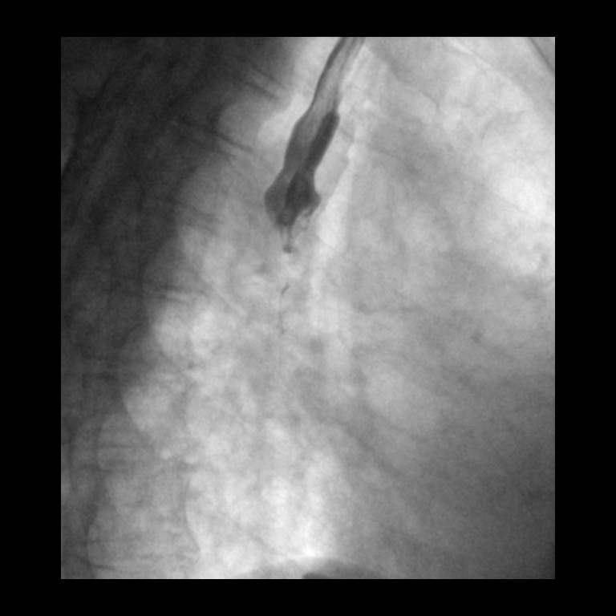
[frame 27/31]
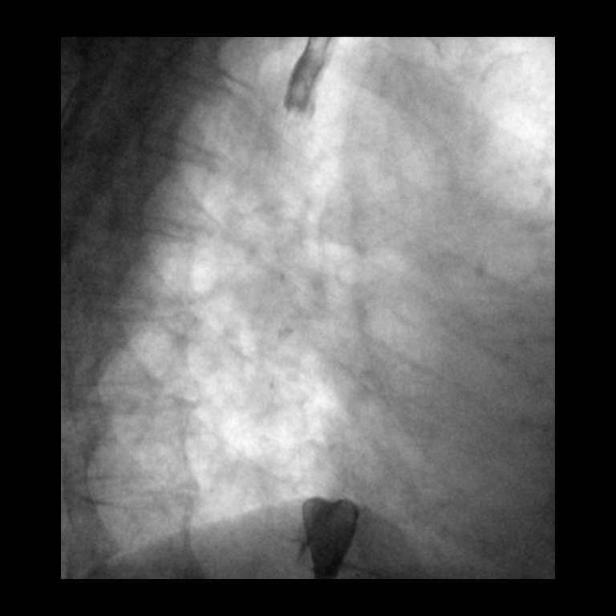

[Series 4: cp_standard · 0.18mm/px · 1 of 12 frames shown (4 of 9)]
[frame 11/12]
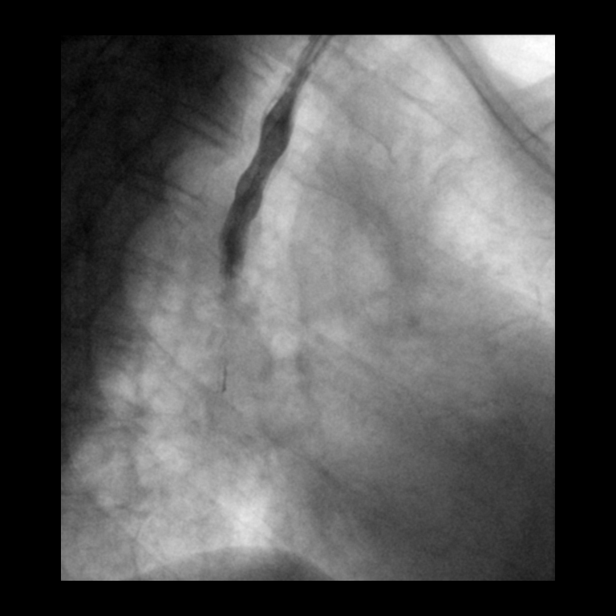

[Series 5: cp_standard · 0.18mm/px · 1 of 22 frames shown (5 of 9)]
[frame 12/22]
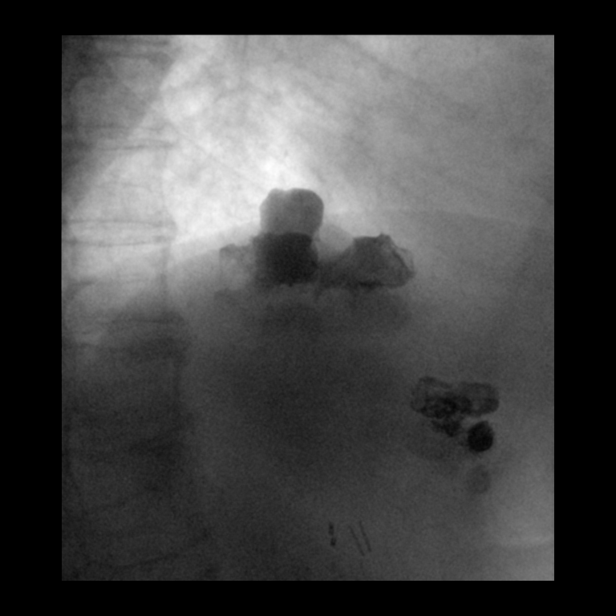

[Series 6: cp_standard · 0.18mm/px · 2 of 40 frames shown (6 of 9)]
[frame 7/40]
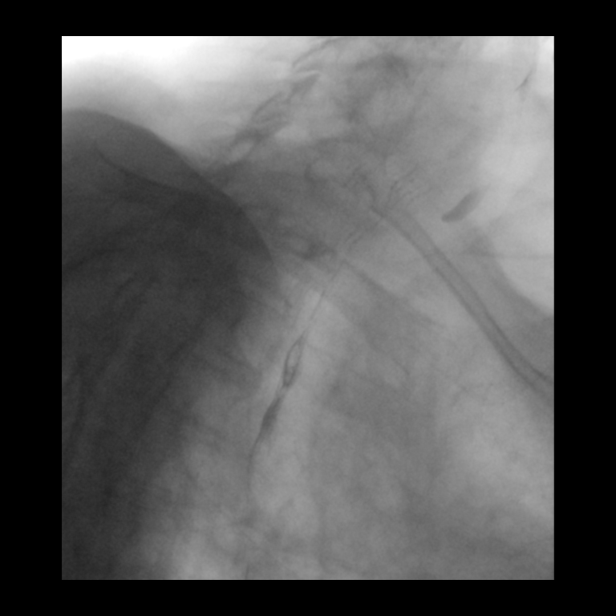
[frame 35/40]
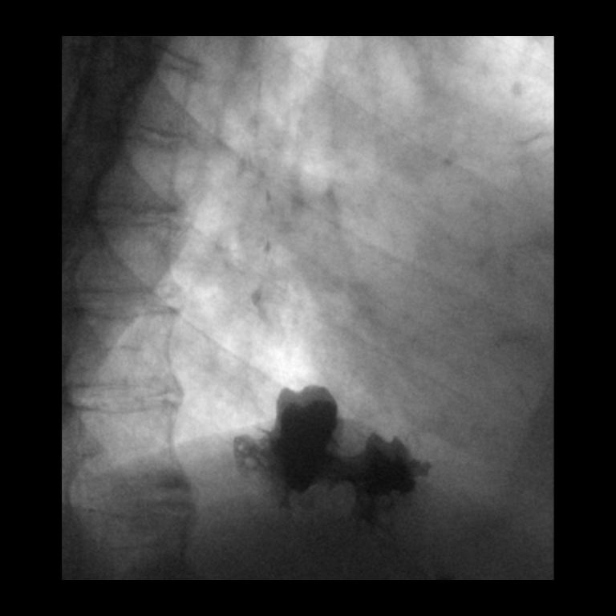

[Series 7: cp_standard · 0.18mm/px · 1 of 35 frames shown (7 of 9)]
[frame 6/35]
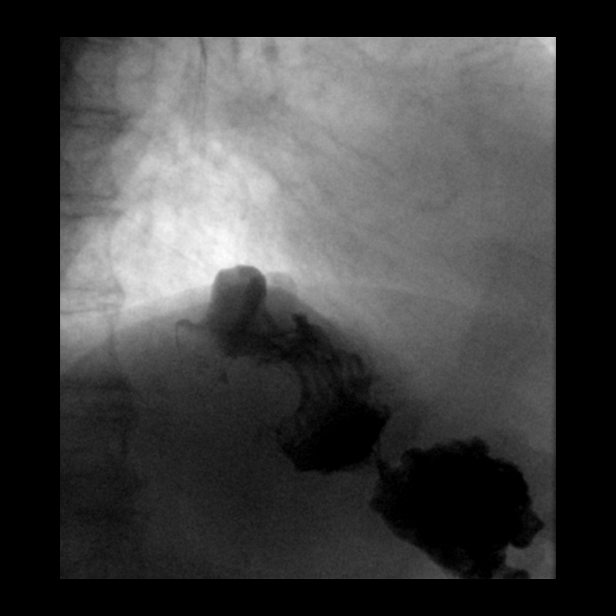

[Series 8: cp_standard · 0.18mm/px · 2 of 46 frames shown (8 of 9)]
[frame 4/46]
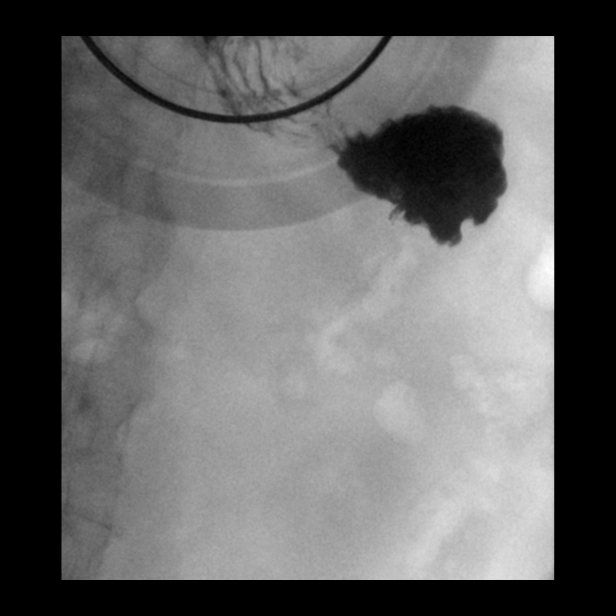
[frame 40/46]
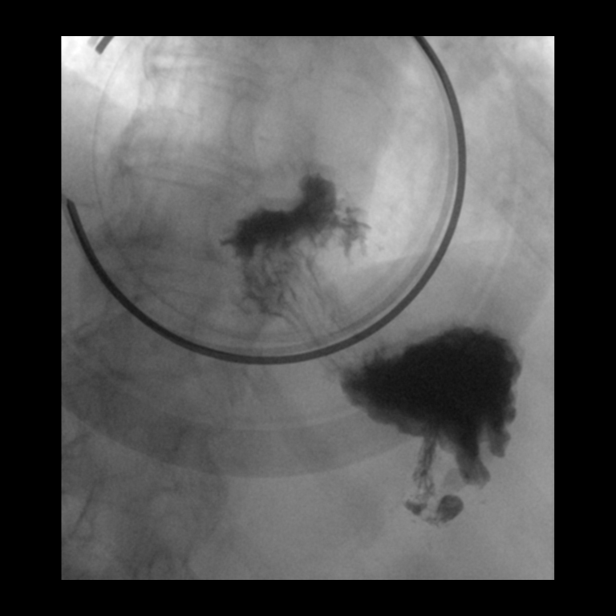

[Series 9: cp_standard · 0.18mm/px · 1 of 38 frames shown (9 of 9)]
[frame 33/38]
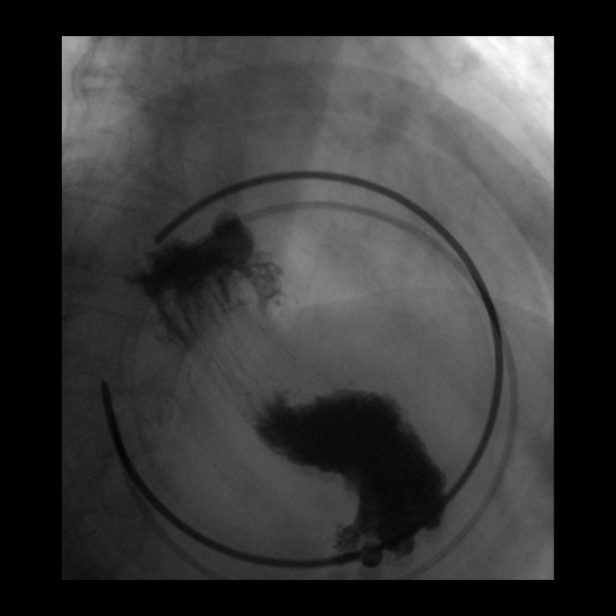

[13 of 24 positions shown; findings below may reference images not displayed]

FINDINGS: The patient was unable to stand or communicate well, and was very
fearful. The examination was performed with patient in the LPO
position.

The patient was unable to swallow on command. It took about 5
minutes of coaxing with intermittent fluoroscopy for the patient to
take her first swallow of the barium, which was a small sip. Because
of this, timing of the radiation exposure for fluoroscopy with
patient swallows was very problematic and difficult. As result, we
only depict 1-2 small sips which do not fully distend the esophagus,
and today's exam must be understood to be highly limited in scope.

There were secondary and tertiary contractions with suspected
dysmotility in the esophagus, primary peristaltic wave terminating
in the mid esophagus on 2 swallows.

There was frank tracheal aspiration on the first swallow, with a
droplet of barium noted in the trachea for example on image [DATE].

I do suspect a small 1 hiatal hernia. In addition, based on the
appearance on images 28-29 of series 6, there is felt to probably be
a distal esophageal mucosal ring which may well be narrow enough to
cause symptoms.

Today's exam was not designed to assess the stomach, but there seems
to be a potential filling defect along the greater curvature of the
stomach on the mucosal relief compression images that I performed
for example on image [DATE].
IMPRESSION: 1. Very limited exam due to the patient's inability to stand or
communicate well. The patient was unable to swallow on command. The
examination was performed with patient in the LPO position.
2. Suspected distal esophageal mucosal ring, probably narrow enough
to cause symptoms.
3. Probable small hiatal hernia.
4. Primary and tertiary contractions in the esophagus, with primary
peristaltic wave terminating in the mid esophagus on 2 swallows. The
appearance suggests esophageal dysmotility.
5. Tracheal aspiration on the first swallow, with a droplet of
barium in the trachea for example on the first swallow. Although the
appearance may be due to narrowing along the neck of the hiatal
hernia, I cannot exclude a filling defect along the greater
curvature of the stomach.
